# Patient Record
Sex: Male | Born: 1965 | Race: White | Hispanic: No | Marital: Married | Smoking: Former smoker
Health system: Southern US, Community
[De-identification: ages and names within clinical notes are randomized; demographics above are authoritative.]

## PROBLEM LIST (undated history)

## (undated) DIAGNOSIS — M1990 Unspecified osteoarthritis, unspecified site: Secondary | ICD-10-CM

## (undated) DIAGNOSIS — J45909 Unspecified asthma, uncomplicated: Secondary | ICD-10-CM

## (undated) DIAGNOSIS — M4722 Other spondylosis with radiculopathy, cervical region: Secondary | ICD-10-CM

## (undated) DIAGNOSIS — F419 Anxiety disorder, unspecified: Secondary | ICD-10-CM

## (undated) DIAGNOSIS — I10 Essential (primary) hypertension: Secondary | ICD-10-CM

## (undated) DIAGNOSIS — R519 Headache, unspecified: Secondary | ICD-10-CM

## (undated) DIAGNOSIS — R0602 Shortness of breath: Secondary | ICD-10-CM

## (undated) HISTORY — PX: WISDOM TOOTH EXTRACTION: SHX21

## (undated) HISTORY — PX: APPENDECTOMY: SHX54

## (undated) HISTORY — PX: INGUINAL HERNIA REPAIR: SUR1180

## (undated) HISTORY — PX: JOINT REPLACEMENT: SHX530

## (undated) HISTORY — PX: TOTAL KNEE ARTHROPLASTY: SHX125

---

## 2011-03-13 ENCOUNTER — Ambulatory Visit (HOSPITAL_COMMUNITY)
Admission: RE | Admit: 2011-03-13 | Discharge: 2011-03-13 | Disposition: A | Discharge status: Home / Self Care | Payer: Worker's Compensation | Source: Ambulatory Visit | Attending: Neurology | Admitting: Neurology

## 2011-03-13 DIAGNOSIS — R55 Syncope and collapse: Secondary | ICD-10-CM | POA: Insufficient documentation

## 2011-03-13 DIAGNOSIS — Z1389 Encounter for screening for other disorder: Secondary | ICD-10-CM | POA: Insufficient documentation

## 2011-03-13 NOTE — Procedures (Signed)
HISTORY:  A 45 year old male with episodes of syncope evaluated to rule out seizure.  MEDICATIONS:  Percocet and Benicar.  CONDITIONS OF RECORDING:  This is a 16-channel EEG carried out with the patient in the awake, drowsy and asleep states.  DISCUSSION:  The waking background activity consists of a low-voltage symmetrical fairly well-organized 9-10 Hz alpha activity seen from the parieto-occipital and posterotemporal regions.  Low-voltage fast activity poorly organized was seen and during at times superimposed on more posterior rhythms.  A mixture of theta and alpha rhythm was seen from the central and temporal regions.  The patient drowses with slowing to irregular which is theta and beta activity.  The patient goes into a light sleep briefly with symmetrical sleep spindles, everted to the sharp activity and irregular slow activity.  Hypoventilation was performed and elicited a mild-to-moderate buildup, but failed to elicit any abnormalities.  Intermittent photic stimulation was performed as well and failed to elicit any change in the tracing.  IMPRESSION:  This is a normal EEG.          ______________________________ Thana Farr, MD    RU:EAVW D:  03/13/2011 17:07:59  T:  03/13/2011 20:53:40  Job #:  [id number]

## 2012-02-23 ENCOUNTER — Encounter (HOSPITAL_COMMUNITY): Payer: Self-pay | Admitting: Pharmacy Technician

## 2012-03-01 NOTE — H&P (Signed)
Billy Shaffer 03/01/2012 9:20 AM [facility]: [facility] Patient #: [id number] DOB: [birth year] Undefined / Language: [place] / Race: Undefined Male   History of Present Illness(Billy Shelvin J [name], PA-C; 03/01/2012 9:22 AM) The patient is a 46 year old male who comes in today for a preoperative History and Physical. The patient is scheduled for a ACDF C3-5 for cervical facet arthrosis to be performed by Dr. Debria Garret D. Shon Baton, MD at [facility] on Wednesday, March 10, 2012 at 0830 . Please see the hospital record for complete dictated history and physical.    Allergies(Billy Raj [name], PA-C; 03/01/2012 9:50 AM) Norco *ANALGESICS - OPIOID*. itching Vicodin *ANALGESICS - OPIOID*. itching   Social History(Billy Shaffer J [name], PA-C; 03/01/2012 9:41 AM) Tobacco use. Former smoker, Recently quit tobacco use.   Medication History(Billy Shaffer [name], PA-C; 03/01/2012 9:42 AM) Lisinopril (40MG  Tablet, Oral) Active. ZyrTEC ( Oral) Specific dose unknown - Active. Chantix (1MG  Tablet, Oral two times daily) Active.   Past Surgical History(Billy Shaffer J [facility], PA-C; 03/01/2012 9:45 AM) Arthroscopic Knee Surgery - Left Appendectomy Open Inguinal Hernia Surgery - Both   Vitals(Billy Shaffer J [facility], PA-C; 03/01/2012 9:36 AM) 03/01/2012 9:35 AM Weight: 225 lb Height: 74 in Body Surface Area: 2.31 m Body Mass Index: 28.89 kg/m Pulse: 120 (Regular) BP: 104/87 (Sitting, Left Arm, Standard)    Physical Exam(Billy Shaffer [name], PA-C; 03/01/2012 11:02 AM) The physical exam findings are as follows:   General General Appearance- pleasant. Not in acute distress. Orientation- Oriented X3. Build & Nutrition- Well nourished and Well developed. Posture- Normal posture. Gait- Normal. Mental Status- Alert.   Integumentary General Characteristics:Surgical Scars- no surgical scar evidence of previous cervical surgery. Cervical Spine- Skin examination of the cervical spine is without deformity,  skin lesions, lacerations or abrasions.   Head and Neck Neck Global Assessment- supple. no lymphadenopathy and no nucchal rigidty.   Eye Pupil- Bilateral- Normal, Direct reaction to light normal, Equal and Regular. Motion- Bilateral- EOMI.   Chest and Lung Exam Auscultation: Breath sounds:- Clear.   Cardiovascular Auscultation:Rhythm- Regular rate and rhythm. Heart Sounds- Normal heart sounds.   Abdomen Palpation/Percussion:Palpation and Percussion of the abdomen reveal - Non Tender, No Rebound tenderness and Soft.   Peripheral Vascular Upper Extremity: Palpation:Radial pulse- Bilateral- 2+. Lower Extremity:Inspection- Bilateral- Inspection Normal. Palpation:Posterior tibial pulse- Bilateral- 2+. Dorsalis pedis pulse- Bilateral- 2+.   Neurologic Sensation:Upper Extremity- Bilateral- sensation is intact in the upper extremity. Reflexes:Biceps Reflex- Bilateral- 2+. Brachioradialis Reflex- Bilateral- 2+. Triceps Reflex- Bilateral- 2+. Babinski- Bilateral- Babinski not present. Hoffman's Sign- Bilateral- Hoffman's sign not present.   Musculoskeletal Spine/Ribs/Pelvis Cervical Spine : Inspection and Palpation:Tenderness- no soft tissue tenderness to palpation and no bony tenderness to palpation. bony/soft tissue palpation of the cervical spine and shoulders does not recreate their typical pain. Strength and Tone: Strength:Deltoid- Bilateral- 5/5. Biceps- Bilateral- 5/5. Triceps- Bilateral- 5/5. Wrist Extension- Bilateral- 5/5. Hand Grip- Bilateral- 5/5. Heel walk- Bilateral- able to heel walk without difficulty. Toe Walk- Bilateral- able to walk on toes without difficulty. Heel-Toe Walk- Bilateral- able to heel-toe walk without difficulty. ROM- Flexion- Full and painful. Extension- Full and painful. Left Rotation - Full. Right Rotation - Full. Pain:- neither flexion or extension is more painful than the  other. Special Testing- axial compression test negative and cross chest impingement test negative. Non-Anatomic Signs- No non-anatomic signs present. Upper Extremity Range of Motion:- No truesholder pain with IR/ER of the shoulders.   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 Risks/benefits/alternatives to the procedure/expectations following the procedure have been reviewed with him by Dr. Shon Shaffer. He understands.  I am going to order an MRI to evaluate to C3-4, C4-5 levels again as his most recent one is outdated at this time.   He has been medically cleared by Dr. Latricia Shaffer. Please see the scanned document in the office chart for the specifics. He has been fitted for a cervical collar and he knows to bring this with him the morning of surgery. He is scheduled for pre-op at [facility]. I have ordered a bone stimulator for him to use post-operatively as he is a prior smoker and is having a multilevel procedure.   All of his questions have been encouraged, addressed and answered. Plan, at this time, is to proceed with surgery as scheduled.   Signed electronically by Billy Maine, PA-C (03/01/2012 11:02 AM)  Billy Shaffer 12/30/2011 11:08 AM Location[facility] Patient #: [id number] WC DOB: 08/15/1965 Single / Language: [place] / Race: White Male   History of Present Illness(Billy Shaffer;  12/30/2011 11:14 AM) The patient is a 46 year old male who presents today for follow up of their neck. The patient is being followed for their central neck pain. Symptoms reported today include: pain (about the posterior cervical ), grinding, weakness (questionable about the left upper ext. ) and numbness (about the right hand (middle through small) and left upper ext. at times ). The patient feels that they are doing poorly. Current treatment includes: relative rest and activity modification. The following medication has been used for pain control: none. The patient presents today following ___ (bilat. facet C4-5 injections, performed on [phone] by Dr. Ethelene Hal ). The patient has reported improvement of their symptoms with: Cortisone injections (minimal relief a few days after injection).    Subjective Transcription(DAHARI D BROOKS, MD; 12/31/2011 4:10 PM)  He now returns after having a C4-5 facet injection. He did get some relief. It did not give as much relief as the 3-4 but he did show some improvement.    Allergies(Billy W Randa Lynn; 12/30/2011 11:14 AM) Wallene Dales. causes itching   Social History(Billy Shaffer; 12/30/2011 11:14 AM) Tobacco use. Current some day smoker. patient has decreased to 7-8 ciag. daily increased [id number]   Medication History(Billy W Randa Lynn; 12/30/2011 11:15 AM) Norco (5-325MG  Tablet, 1 (one) Oral every 8 hours as needed for pain, Taken 09/26/2011 to 10/26/2011) Inactive.   Assessment & Plan(Billy Shaffer; 12/30/2011 12:02 PM) Pain, cervical (723.1) Current Plans l MRI Cervical Spine (78295) (MRI Cervical pre-op eval. ; No metal ; Not chlaus.) l Restarted Percocet 5-325MG , 1 Tablet q 8hrs prn pain, #90, 30 days starting 12/30/2011, No Refill.   Plans Transcription(DAHARI D BROOKS, MD; 12/31/2011 4:10 PM)  At this point after a long discussion with the patient and his wife he would like to have both levels done. I think this is reasonable as there is a risk  of adjacent segment disease and the 4-5 becoming a problem and requiring another operation. After discussing this in great detail,  I think it is reasonable to proceed with a two level ACDF. We have gone over the risks which include infection, bleeding, nerve damage, death, stroke, paralysis, failure to heal, ongoing or worse pain, need for further surgery, loss of bowel and bladder control, nonunion, hardware complication. All questions were answered for the patient and his wife. We will plan on proceeding with the surgery in a timely fashion. He has not had an advanced imaging study since December and therefore I would like to get an MRI as we are greater than six months and I want to make sure there has been no significant changes. If there are, we will discuss them and if we need to alter the surgical plan we will but I do not foresee that currently. Because of the multilevel nature of the procedure and the fact that he still uses nicotine he has a higher than likely chance of a pseudoarthrosis. Given that I am going to recommend an external bone stimulator to be used for at least one year.    Miscellaneous Transcription(DAHARI Sheela Stack, MD; 12/31/2011 4:10 PM)  Alvy Beal, MD/MMK    T: 12/31/11  D: 12/30/11      Signed electronically by Alvy Beal, MD (12/30/2011 4:51 PM)

## 2012-03-03 ENCOUNTER — Encounter (HOSPITAL_COMMUNITY): Payer: Self-pay

## 2012-03-03 ENCOUNTER — Encounter (HOSPITAL_COMMUNITY)
Admission: RE | Admit: 2012-03-03 | Discharge: 2012-03-03 | Disposition: A | Discharge status: Home / Self Care | Payer: BC Managed Care – PPO | Source: Ambulatory Visit | Attending: Orthopedic Surgery | Admitting: Orthopedic Surgery

## 2012-03-03 ENCOUNTER — Encounter (HOSPITAL_COMMUNITY)
Admission: RE | Admit: 2012-03-03 | Discharge: 2012-03-03 | Disposition: A | Discharge status: Home / Self Care | Payer: BC Managed Care – PPO | Source: Ambulatory Visit | Attending: Physician Assistant | Admitting: Physician Assistant

## 2012-03-03 HISTORY — DX: Essential (primary) hypertension: I10

## 2012-03-03 HISTORY — DX: Unspecified asthma, uncomplicated: J45.909

## 2012-03-03 HISTORY — DX: Anxiety disorder, unspecified: F41.9

## 2012-03-03 LAB — CBC
HCT: 44.9 % (ref 39.0–52.0)
Hemoglobin: 15.6 g/dL (ref 13.0–17.0)
MCH: 32.1 pg (ref 26.0–34.0)
MCHC: 34.7 g/dL (ref 30.0–36.0)
MCV: 92.4 fL (ref 78.0–100.0)
Platelets: 215 10*3/uL (ref 150–400)
RBC: 4.86 MIL/uL (ref 4.22–5.81)
RDW: 13.8 % (ref 11.5–15.5)
WBC: 8.2 10*3/uL (ref 4.0–10.5)

## 2012-03-03 LAB — BASIC METABOLIC PANEL
BUN: 13 mg/dL (ref 6–23)
CO2: 27 mEq/L (ref 19–32)
Calcium: 9.7 mg/dL (ref 8.4–10.5)
Chloride: 97 mEq/L (ref 96–112)
Creatinine, Ser: 1 mg/dL (ref 0.50–1.35)
GFR calc Af Amer: >90 mL/min (ref >90)
GFR calc non Af Amer: 89 mL/min — ABNORMAL LOW (ref >90)
Glucose, Bld: 105 mg/dL — ABNORMAL HIGH (ref 70–99)
Potassium: 4.8 mEq/L (ref 3.5–5.1)
Sodium: 134 mEq/L — ABNORMAL LOW (ref 135–145)

## 2012-03-03 LAB — SURGICAL PCR SCREEN
MRSA, PCR: NEGATIVE
Staphylococcus aureus: NEGATIVE

## 2012-03-03 NOTE — Pre-Procedure Instructions (Signed)
Billy Shaffer  03/03/2012   Your procedure is scheduled on:  Wednesday March 10, 2012.  Report to [facility] at 0630 AM.  Call this number if you have problems the morning of surgery: [phone]   Remember:   Do not eat food or drink:After Midnight.    Take these medicines the morning of surgery with A SIP OF WATER: Hydrocodone (Vicodin) if needed for pain   Do not wear jewelry  Do not wear lotions or colognes.   Men may shave face and neck.  Do not bring valuables to the hospital.  Contacts, dentures or bridgework may not be worn into surgery.  Leave suitcase in the car. After surgery it may be brought to your room.  For patients admitted to the hospital, checkout time is 11:00 AM the day of discharge.   Patients discharged the day of surgery will not be allowed to drive home.  Name and phone number of your driver:   Special Instructions: CHG Shower Use Special Wash: 1/2 bottle night before surgery and 1/2 bottle morning of surgery.   Please read over the following fact sheets that you were given: Pain Booklet, Coughing and Deep Breathing, MRSA Information and Surgical Site Infection Prevention

## 2012-03-09 MED ORDER — CEFAZOLIN SODIUM-DEXTROSE 2-3 GM-% IV SOLR
2.0000 g | INTRAVENOUS | Status: DC
  Filled 2012-03-09: qty 50

## 2012-03-10 ENCOUNTER — Ambulatory Visit (HOSPITAL_COMMUNITY): Payer: BC Managed Care – PPO

## 2012-03-10 ENCOUNTER — Encounter (HOSPITAL_COMMUNITY): Payer: Self-pay | Admitting: Anesthesiology

## 2012-03-10 ENCOUNTER — Encounter (HOSPITAL_COMMUNITY)
Admission: RE | Disposition: A | Discharge status: Home / Self Care | Payer: Self-pay | Source: Ambulatory Visit | Attending: Orthopedic Surgery

## 2012-03-10 ENCOUNTER — Ambulatory Visit (HOSPITAL_COMMUNITY): Payer: BC Managed Care – PPO | Admitting: Anesthesiology

## 2012-03-10 ENCOUNTER — Ambulatory Visit (HOSPITAL_COMMUNITY)
Admission: RE | Admit: 2012-03-10 | Discharge: 2012-03-11 | Disposition: A | Discharge status: Home / Self Care | Payer: BC Managed Care – PPO | Source: Ambulatory Visit | Attending: Orthopedic Surgery | Admitting: Orthopedic Surgery

## 2012-03-10 DIAGNOSIS — I10 Essential (primary) hypertension: Secondary | ICD-10-CM | POA: Insufficient documentation

## 2012-03-10 DIAGNOSIS — F411 Generalized anxiety disorder: Secondary | ICD-10-CM | POA: Insufficient documentation

## 2012-03-10 DIAGNOSIS — M5412 Radiculopathy, cervical region: Secondary | ICD-10-CM

## 2012-03-10 DIAGNOSIS — M47812 Spondylosis without myelopathy or radiculopathy, cervical region: Secondary | ICD-10-CM | POA: Insufficient documentation

## 2012-03-10 HISTORY — PX: ANTERIOR CERVICAL DECOMP/DISCECTOMY FUSION: SHX1161

## 2012-03-10 SURGERY — ANTERIOR CERVICAL DECOMPRESSION/DISCECTOMY FUSION 2 LEVELS
Anesthesia: General | Site: Spine Cervical | Wound class: Clean

## 2012-03-10 MED ORDER — OXYCODONE HCL 5 MG PO TABS
10.0000 mg | ORAL_TABLET | ORAL | Status: DC | PRN
  Administered 2012-03-10 – 2012-03-11 (×5): 10 mg via ORAL
  Filled 2012-03-10 (×5): qty 2

## 2012-03-10 MED ORDER — CEFAZOLIN SODIUM-DEXTROSE 2-3 GM-% IV SOLR
INTRAVENOUS | Status: DC | PRN
  Administered 2012-03-10: 2 g via INTRAVENOUS

## 2012-03-10 MED ORDER — LISINOPRIL 40 MG PO TABS
40.0000 mg | ORAL_TABLET | Freq: Every day | ORAL | Status: DC
  Administered 2012-03-10 – 2012-03-11 (×2): 40 mg via ORAL
  Filled 2012-03-10 (×2): qty 1

## 2012-03-10 MED ORDER — BUPIVACAINE-EPINEPHRINE 0.25% -1:200000 IJ SOLN
INTRAMUSCULAR | Status: DC | PRN
  Administered 2012-03-10: 3 mL

## 2012-03-10 MED ORDER — CEFAZOLIN SODIUM 1-5 GM-% IV SOLN
1.0000 g | Freq: Three times a day (TID) | INTRAVENOUS | Status: AC
  Administered 2012-03-10 – 2012-03-11 (×2): 1 g via INTRAVENOUS
  Filled 2012-03-10 (×2): qty 50

## 2012-03-10 MED ORDER — HYDROMORPHONE HCL PF 1 MG/ML IJ SOLN
INTRAMUSCULAR | Status: AC
  Administered 2012-03-10: 0.5 mg
  Filled 2012-03-10: qty 1

## 2012-03-10 MED ORDER — LIDOCAINE HCL (CARDIAC) 20 MG/ML IV SOLN
INTRAVENOUS | Status: DC | PRN
  Administered 2012-03-10: 60 mg via INTRAVENOUS

## 2012-03-10 MED ORDER — NEOSTIGMINE METHYLSULFATE 1 MG/ML IJ SOLN
INTRAMUSCULAR | Status: DC | PRN
  Administered 2012-03-10: 5 mg via INTRAVENOUS

## 2012-03-10 MED ORDER — FENTANYL CITRATE 0.05 MG/ML IJ SOLN
INTRAMUSCULAR | Status: AC
  Filled 2012-03-10: qty 2

## 2012-03-10 MED ORDER — DEXAMETHASONE SODIUM PHOSPHATE 4 MG/ML IJ SOLN
4.0000 mg | Freq: Four times a day (QID) | INTRAMUSCULAR | Status: DC
  Administered 2012-03-11: 4 mg via INTRAVENOUS
  Filled 2012-03-10 (×7): qty 1

## 2012-03-10 MED ORDER — EPHEDRINE SULFATE 50 MG/ML IJ SOLN
INTRAMUSCULAR | Status: DC | PRN
  Administered 2012-03-10 (×4): 10 mg via INTRAVENOUS

## 2012-03-10 MED ORDER — MIDAZOLAM HCL 5 MG/5ML IJ SOLN
INTRAMUSCULAR | Status: DC | PRN
  Administered 2012-03-10: 2 mg via INTRAVENOUS

## 2012-03-10 MED ORDER — DOCUSATE SODIUM 100 MG PO CAPS
100.0000 mg | ORAL_CAPSULE | Freq: Two times a day (BID) | ORAL | Status: DC
  Administered 2012-03-10 – 2012-03-11 (×2): 100 mg via ORAL
  Filled 2012-03-10 (×3): qty 1

## 2012-03-10 MED ORDER — HYDROMORPHONE HCL PF 1 MG/ML IJ SOLN
0.5000 mg | Freq: Once | INTRAMUSCULAR | Status: AC
  Administered 2012-03-10: 0.5 mg via INTRAVENOUS

## 2012-03-10 MED ORDER — ONDANSETRON HCL 4 MG/2ML IJ SOLN: 4.0000 mg | INTRAMUSCULAR | Status: DC | PRN

## 2012-03-10 MED ORDER — THROMBIN 20000 UNITS EX KIT
PACK | OROMUCOSAL | Status: DC | PRN
  Administered 2012-03-10: 09:00:00 via TOPICAL

## 2012-03-10 MED ORDER — SODIUM CHLORIDE 0.9 % IV SOLN: 250.0000 mL | INTRAVENOUS | Status: DC

## 2012-03-10 MED ORDER — DEXAMETHASONE SODIUM PHOSPHATE 10 MG/ML IJ SOLN
10.0000 mg | Freq: Once | INTRAMUSCULAR | Status: DC
  Filled 2012-03-10: qty 1

## 2012-03-10 MED ORDER — HYDROMORPHONE HCL PF 1 MG/ML IJ SOLN
INTRAMUSCULAR | Status: AC
  Administered 2012-03-10 (×2): 0.5 mg via INTRAVENOUS
  Filled 2012-03-10: qty 1

## 2012-03-10 MED ORDER — ACETAMINOPHEN 10 MG/ML IV SOLN
INTRAVENOUS | Status: DC | PRN
  Administered 2012-03-10: 1000 mg via INTRAVENOUS

## 2012-03-10 MED ORDER — LIDOCAINE HCL 4 % MT SOLN
OROMUCOSAL | Status: DC | PRN
  Administered 2012-03-10: 4 mL via TOPICAL

## 2012-03-10 MED ORDER — SUFENTANIL CITRATE 50 MCG/ML IV SOLN
INTRAVENOUS | Status: DC | PRN
  Administered 2012-03-10 (×3): 10 ug via INTRAVENOUS
  Administered 2012-03-10: 20 ug via INTRAVENOUS

## 2012-03-10 MED ORDER — PROPOFOL 10 MG/ML IV EMUL
INTRAVENOUS | Status: DC | PRN
  Administered 2012-03-10: 250 mg via INTRAVENOUS

## 2012-03-10 MED ORDER — ACETAMINOPHEN 10 MG/ML IV SOLN
INTRAVENOUS | Status: AC
  Filled 2012-03-10: qty 100

## 2012-03-10 MED ORDER — PHENYLEPHRINE HCL 10 MG/ML IJ SOLN
INTRAMUSCULAR | Status: DC | PRN
  Administered 2012-03-10 (×7): 80 ug via INTRAVENOUS

## 2012-03-10 MED ORDER — ROCURONIUM BROMIDE 100 MG/10ML IV SOLN
INTRAVENOUS | Status: DC | PRN
  Administered 2012-03-10: 10 mg via INTRAVENOUS
  Administered 2012-03-10: 20 mg via INTRAVENOUS
  Administered 2012-03-10: 50 mg via INTRAVENOUS

## 2012-03-10 MED ORDER — THROMBIN 20000 UNITS EX SOLR
CUTANEOUS | Status: AC
  Filled 2012-03-10: qty 20000

## 2012-03-10 MED ORDER — ONDANSETRON HCL 4 MG/2ML IJ SOLN
INTRAMUSCULAR | Status: DC | PRN
  Administered 2012-03-10: 4 mg via INTRAVENOUS

## 2012-03-10 MED ORDER — BUPIVACAINE-EPINEPHRINE PF 0.25-1:200000 % IJ SOLN
INTRAMUSCULAR | Status: AC
  Filled 2012-03-10: qty 30

## 2012-03-10 MED ORDER — MENTHOL 3 MG MT LOZG: 1.0000 | LOZENGE | OROMUCOSAL | Status: DC | PRN

## 2012-03-10 MED ORDER — MORPHINE SULFATE 2 MG/ML IJ SOLN
1.0000 mg | INTRAMUSCULAR | Status: DC | PRN
  Administered 2012-03-10 (×2): 2 mg via INTRAVENOUS
  Administered 2012-03-10 (×2): 4 mg via INTRAVENOUS
  Filled 2012-03-10: qty 1
  Filled 2012-03-10: qty 2
  Filled 2012-03-10: qty 1
  Filled 2012-03-10: qty 2

## 2012-03-10 MED ORDER — ZOLPIDEM TARTRATE 5 MG PO TABS
5.0000 mg | ORAL_TABLET | Freq: Every evening | ORAL | Status: DC | PRN
  Administered 2012-03-10: 5 mg via ORAL
  Filled 2012-03-10: qty 1

## 2012-03-10 MED ORDER — PHENOL 1.4 % MT LIQD: 1.0000 | OROMUCOSAL | Status: DC | PRN

## 2012-03-10 MED ORDER — LACTATED RINGERS IV SOLN
INTRAVENOUS | Status: DC
  Administered 2012-03-10: 16:00:00 via INTRAVENOUS

## 2012-03-10 MED ORDER — ACETAMINOPHEN 10 MG/ML IV SOLN
1000.0000 mg | Freq: Once | INTRAVENOUS | Status: AC
  Filled 2012-03-10: qty 100

## 2012-03-10 MED ORDER — SODIUM CHLORIDE 0.9 % IJ SOLN: 3.0000 mL | Freq: Two times a day (BID) | INTRAMUSCULAR | Status: DC

## 2012-03-10 MED ORDER — 0.9 % SODIUM CHLORIDE (POUR BTL) OPTIME
TOPICAL | Status: DC | PRN
  Administered 2012-03-10: 1000 mL

## 2012-03-10 MED ORDER — LACTATED RINGERS IV SOLN: INTRAVENOUS | Status: DC

## 2012-03-10 MED ORDER — SODIUM CHLORIDE 0.9 % IJ SOLN: 3.0000 mL | INTRAMUSCULAR | Status: DC | PRN

## 2012-03-10 MED ORDER — METHOCARBAMOL 100 MG/ML IJ SOLN
500.0000 mg | Freq: Four times a day (QID) | INTRAMUSCULAR | Status: DC | PRN
  Administered 2012-03-10: 500 mg via INTRAVENOUS
  Filled 2012-03-10: qty 5

## 2012-03-10 MED ORDER — ACETAMINOPHEN 10 MG/ML IV SOLN
1000.0000 mg | Freq: Four times a day (QID) | INTRAVENOUS | Status: DC
  Administered 2012-03-10 – 2012-03-11 (×3): 1000 mg via INTRAVENOUS
  Filled 2012-03-10 (×4): qty 100

## 2012-03-10 MED ORDER — METHOCARBAMOL 500 MG PO TABS
500.0000 mg | ORAL_TABLET | Freq: Four times a day (QID) | ORAL | Status: DC | PRN
  Administered 2012-03-10 – 2012-03-11 (×2): 500 mg via ORAL
  Filled 2012-03-10 (×3): qty 1

## 2012-03-10 MED ORDER — LACTATED RINGERS IV SOLN
INTRAVENOUS | Status: DC | PRN
  Administered 2012-03-10 (×3): via INTRAVENOUS

## 2012-03-10 MED ORDER — GLYCOPYRROLATE 0.2 MG/ML IJ SOLN
INTRAMUSCULAR | Status: DC | PRN
  Administered 2012-03-10: .8 mg via INTRAVENOUS

## 2012-03-10 MED ORDER — DEXAMETHASONE 4 MG PO TABS
4.0000 mg | ORAL_TABLET | Freq: Four times a day (QID) | ORAL | Status: DC
  Administered 2012-03-10 (×2): 4 mg via ORAL
  Filled 2012-03-10 (×7): qty 1

## 2012-03-10 MED ORDER — FENTANYL CITRATE 0.05 MG/ML IJ SOLN
25.0000 ug | INTRAMUSCULAR | Status: DC | PRN
  Administered 2012-03-10 (×3): 50 ug via INTRAVENOUS

## 2012-03-10 SURGICAL SUPPLY — 64 items
BLADE SURG ROTATE 9660 (MISCELLANEOUS) IMPLANT
BUR EGG ELITE 4.0 (BURR) IMPLANT
BUR MATCHSTICK NEURO 3.0 LAGG (BURR) IMPLANT
CANISTER SUCTION 2500CC (MISCELLANEOUS) ×2 IMPLANT
CLOTH BEACON ORANGE TIMEOUT ST (SAFETY) ×2 IMPLANT
CLSR STERI-STRIP ANTIMIC 1/2X4 (GAUZE/BANDAGES/DRESSINGS) ×2 IMPLANT
CORDS BIPOLAR (ELECTRODE) ×2 IMPLANT
COVER SURGICAL LIGHT HANDLE (MISCELLANEOUS) ×2 IMPLANT
CRADLE DONUT ADULT HEAD (MISCELLANEOUS) ×2 IMPLANT
DERMABOND ADVANCED (GAUZE/BANDAGES/DRESSINGS) ×1
DERMABOND ADVANCED .7 DNX12 (GAUZE/BANDAGES/DRESSINGS) ×1 IMPLANT
DEVICE ENDSKLTN TC MED 8MM (Orthopedic Implant) ×1 IMPLANT
DISTRACTION PIN ×4 IMPLANT
DRAPE C-ARM 42X72 X-RAY (DRAPES) ×2 IMPLANT
DRAPE POUCH INSTRU U-SHP 10X18 (DRAPES) ×2 IMPLANT
DRAPE SURG 17X23 STRL (DRAPES) ×2 IMPLANT
DRAPE U-SHAPE 47X51 STRL (DRAPES) ×2 IMPLANT
DRSG MEPILEX BORDER 4X4 (GAUZE/BANDAGES/DRESSINGS) ×2 IMPLANT
DURAPREP 26ML APPLICATOR (WOUND CARE) ×2 IMPLANT
ELECT COATED BLADE 2.86 ST (ELECTRODE) ×2 IMPLANT
ELECT REM PT RETURN 9FT ADLT (ELECTROSURGICAL) ×2
ELECTRODE REM PT RTRN 9FT ADLT (ELECTROSURGICAL) ×1 IMPLANT
ENDOSKELTON TC IMPLANT 8MM MED (Orthopedic Implant) ×2 IMPLANT
GLOVE BIOGEL PI IND STRL 6.5 (GLOVE) ×1 IMPLANT
GLOVE BIOGEL PI IND STRL 8.5 (GLOVE) ×1 IMPLANT
GLOVE BIOGEL PI INDICATOR 6.5 (GLOVE) ×1
GLOVE BIOGEL PI INDICATOR 8.5 (GLOVE) ×1
GLOVE ECLIPSE 6.0 STRL STRAW (GLOVE) ×2 IMPLANT
GLOVE ECLIPSE 8.5 STRL (GLOVE) ×2 IMPLANT
GOWN PREVENTION PLUS XXLARGE (GOWN DISPOSABLE) ×2 IMPLANT
GOWN STRL NON-REIN LRG LVL3 (GOWN DISPOSABLE) ×4 IMPLANT
KIT BASIN OR (CUSTOM PROCEDURE TRAY) ×2 IMPLANT
KIT ROOM TURNOVER OR (KITS) ×2 IMPLANT
LORDOTIC CERVICAL VBR MED 9MM (Bone Implant) ×2 IMPLANT
NEEDLE SPNL 18GX3.5 QUINCKE PK (NEEDLE) ×2 IMPLANT
NS IRRIG 1000ML POUR BTL (IV SOLUTION) ×2 IMPLANT
PACK ORTHO CERVICAL (CUSTOM PROCEDURE TRAY) ×2 IMPLANT
PACK UNIVERSAL I (CUSTOM PROCEDURE TRAY) ×2 IMPLANT
PAD ARMBOARD 7.5X6 YLW CONV (MISCELLANEOUS) ×4 IMPLANT
PIN FIXATION TEMP (PIN) ×2 IMPLANT
PLATE VECTRA 36MM (Plate) ×2 IMPLANT
PUTTY BONE DBX 2.5 MIS (Bone Implant) ×2 IMPLANT
SCREW 4.0X14MM (Screw) ×1 IMPLANT
SCREW 4.0X16MM (Screw) ×2 IMPLANT
SCREW BN 14X4XSLF DRL VA SLF (Screw) ×1 IMPLANT
SPONGE INTESTINAL PEANUT (DISPOSABLE) ×6 IMPLANT
SPONGE LAP 4X18 X RAY DECT (DISPOSABLE) ×2 IMPLANT
SPONGE SURGIFOAM ABS GEL 100 (HEMOSTASIS) ×2 IMPLANT
STRIP CLOSURE SKIN 1/2X4 (GAUZE/BANDAGES/DRESSINGS) ×2 IMPLANT
SURGIFLO TRUKIT (HEMOSTASIS) IMPLANT
SUT MNCRL AB 3-0 PS2 18 (SUTURE) ×2 IMPLANT
SUT SILK 2 0 (SUTURE) ×1
SUT SILK 2-0 18XBRD TIE 12 (SUTURE) ×1 IMPLANT
SUT VIC AB 2-0 CT1 18 (SUTURE) ×2 IMPLANT
SUT VIC AB 3-0 54X BRD REEL (SUTURE) IMPLANT
SUT VIC AB 3-0 BRD 54 (SUTURE)
SYR BULB IRRIGATION 50ML (SYRINGE) ×2 IMPLANT
SYR CONTROL 10ML LL (SYRINGE) ×2 IMPLANT
TAPE CLOTH 4X10 WHT NS (GAUZE/BANDAGES/DRESSINGS) ×2 IMPLANT
TAPE UMBILICAL COTTON 1/8X30 (MISCELLANEOUS) ×2 IMPLANT
TOWEL OR 17X24 6PK STRL BLUE (TOWEL DISPOSABLE) ×2 IMPLANT
TOWEL OR 17X26 10 PK STRL BLUE (TOWEL DISPOSABLE) ×2 IMPLANT
TRAY FOLEY CATH 14FR (SET/KITS/TRAYS/PACK) IMPLANT
WATER STERILE IRR 1000ML POUR (IV SOLUTION) ×2 IMPLANT

## 2012-03-10 NOTE — Anesthesia Preprocedure Evaluation (Signed)
Anesthesia Evaluation  Patient identified by MRN, date of birth, ID band Patient awake    Reviewed: Allergy & Precautions, H&P , NPO status , Patient's Chart, lab work & pertinent test results  Airway Mallampati: II      Dental   Pulmonary  History of bronchitis         Cardiovascular hypertension, Pt. on medications     Neuro/Psych negative neurological ROS     GI/Hepatic negative GI ROS, Neg liver ROS,   Endo/Other  negative endocrine ROS  Renal/GU negative Renal ROS     Musculoskeletal   Abdominal   Peds  Hematology   Anesthesia Other Findings   Reproductive/Obstetrics                           Anesthesia Physical Anesthesia Plan  ASA: III  Anesthesia Plan: General   Post-op Pain Management:    Induction: Intravenous  Airway Management Planned: Oral ETT  Additional Equipment:   Intra-op Plan:   Post-operative Plan: Extubation in OR  Informed Consent:   Plan Discussed with: CRNA, Anesthesiologist and Surgeon  Anesthesia Plan Comments:         Anesthesia Quick Evaluation

## 2012-03-10 NOTE — Brief Op Note (Signed)
03/10/2012  11:38 AM  PATIENT:  Billy Shaffer  46 y.o. male  PRE-OPERATIVE DIAGNOSIS:  CERVICLE SACET ARTHROSIS  POST-OPERATIVE DIAGNOSIS:  CERVICLE SACET ARTHROSIS  PROCEDURE:  Procedure(s) (LRB): ANTERIOR CERVICAL DECOMPRESSION/DISCECTOMY FUSION 2 LEVELS (N/A)  SURGEON:  Surgeon(s) and Role:    * Venita Lick, MD - Primary  PHYSICIAN ASSISTANT:   ASSISTANTS: Norval Gable   ANESTHESIA:   general  EBL:  Total I/O In: 2000 [I.V.:2000] Out: -   BLOOD ADMINISTERED:none  DRAINS: none   LOCAL MEDICATIONS USED:  MARCAINE     SPECIMEN:  No Specimen  DISPOSITION OF SPECIMEN:  N/A  COUNTS:  YES  TOURNIQUET:  * No tourniquets in log *  DICTATION: .Other Dictation: Dictation Number [id number]  PLAN OF CARE: Admit for overnight observation  PATIENT DISPOSITION:  PACU - hemodynamically stable.

## 2012-03-10 NOTE — Anesthesia Procedure Notes (Addendum)
Performed by: Glendora Score A   Procedure Name: Intubation Date/Time: 03/10/2012 8:51 AM Performed by: Glendora Score A Pre-anesthesia Checklist: Patient identified, Emergency Drugs available, Suction available and Patient being monitored Patient Re-evaluated:Patient Re-evaluated prior to inductionOxygen Delivery Method: Circle system utilized Preoxygenation: Pre-oxygenation with 100% oxygen Intubation Type: IV induction Ventilation: Mask ventilation without difficulty and Oral airway inserted - appropriate to patient size Laryngoscope Size: Hyacinth Meeker and 2 Grade View: Grade I Tube type: Oral Tube size: 7.5 mm Number of attempts: 1 Airway Equipment and Method: Stylet and LTA kit utilized Placement Confirmation: ETT inserted through vocal cords under direct vision,  positive ETCO2 and breath sounds checked- equal and bilateral Secured at: 23 cm Tube secured with: Tape Dental Injury: Teeth and Oropharynx as per pre-operative assessment

## 2012-03-10 NOTE — Anesthesia Postprocedure Evaluation (Signed)
  Anesthesia Post-op Note  Patient: Billy Shaffer  Procedure(s) Performed: Procedure(s) (LRB): ANTERIOR CERVICAL DECOMPRESSION/DISCECTOMY FUSION 2 LEVELS (N/A)  Patient Location: PACU  Anesthesia Type: General  Level of Consciousness: awake  Airway and Oxygen Therapy: Patient Spontanous Breathing  Post-op Pain: mild  Post-op Assessment: Post-op Vital signs reviewed  Post-op Vital Signs: Reviewed  Complications: No apparent anesthesia complications

## 2012-03-10 NOTE — Preoperative (Signed)
Beta Blockers   Reason not to administer Beta Blockers:Not Applicable 

## 2012-03-10 NOTE — Transfer of Care (Signed)
Immediate Anesthesia Transfer of Care Note  Patient: Billy Shaffer  Procedure(s) Performed: Procedure(s) (LRB): ANTERIOR CERVICAL DECOMPRESSION/DISCECTOMY FUSION 2 LEVELS (N/A)  Patient Location: PACU  Anesthesia Type: General  Level of Consciousness: awake, alert , oriented and patient cooperative  Airway & Oxygen Therapy: Patient Spontanous Breathing and Patient connected to face mask oxygen  Post-op Assessment: Report given to PACU RN  Post vital signs: Reviewed and stable  Complications: No apparent anesthesia complications

## 2012-03-10 NOTE — H&P (Signed)
No change in exam H+P reviewed  

## 2012-03-11 MED ORDER — HYDROMORPHONE HCL PF 1 MG/ML IJ SOLN
2.0000 mg | INTRAMUSCULAR | Status: DC | PRN
  Administered 2012-03-11 (×3): 1 mg via INTRAVENOUS
  Filled 2012-03-11 (×2): qty 1

## 2012-03-11 MED ORDER — DIAZEPAM 5 MG PO TABS: 5.0000 mg | ORAL_TABLET | Freq: Three times a day (TID) | ORAL | Status: DC | PRN

## 2012-03-11 MED ORDER — POLYETHYLENE GLYCOL 3350 17 G PO PACK: 17.0000 g | PACK | Freq: Every day | ORAL | Status: AC

## 2012-03-11 MED ORDER — OXYCODONE-ACETAMINOPHEN 10-325 MG PO TABS: 1.0000 | ORAL_TABLET | Freq: Four times a day (QID) | ORAL | Status: DC | PRN

## 2012-03-11 MED ORDER — ONDANSETRON HCL 4 MG PO TABS: 4.0000 mg | ORAL_TABLET | Freq: Three times a day (TID) | ORAL | Status: DC | PRN

## 2012-03-11 MED ORDER — DIAZEPAM 5 MG PO TABS
5.0000 mg | ORAL_TABLET | Freq: Three times a day (TID) | ORAL | Status: DC | PRN
  Administered 2012-03-11: 5 mg via ORAL
  Filled 2012-03-11: qty 1

## 2012-03-11 MED ORDER — MORPHINE SULFATE 25 MG/ML IV SOLN
1.0000 mg/h | INTRAVENOUS | Status: DC
  Filled 2012-03-11: qty 10

## 2012-03-11 NOTE — Op Note (Signed)
NAME:  Billy Shaffer, Billy Shaffer NO.:  [id number]  MEDICAL RECORD NO.:  [id number]  LOCATION:  [id number]                        FACILITY:  [facility]  PHYSICIAN:  Alvy Beal, MD    DATE OF BIRTH:  Apr 12, 1966  DATE OF PROCEDURE:  03/10/2012 DATE OF DISCHARGE:                              OPERATIVE REPORT   PREOPERATIVE DIAGNOSIS:  Axillary neck pain due to severe facet arthrosis at C3-4, C4-5.  POSTOPERATIVE DIAGNOSIS:  Axillary neck pain due to severe facet arthrosis at C3-4, C4-5.  OPERATIVE PROCEDURE:  Anterior cervical diskectomy and fusion at C3-4, C4-5.  FIRST ASSISTANT:  Norval Gable, [place].  INSTRUMENT USED:  Instrumentation system used was the titanium intervertebral spacer at C3-4 used a size 8 medium lordotic packed with DBX mix at C4-5.  We used a size 9 medium lordotic again packed with DBX mix.  An anterior cervical plating system was a Synthes Vectra 36 mm long with 16 mm screws into the body of C3, 14 screw at the body of C4 and C5.  COMPLICATIONS:  None.  CONDITION:  Stable.  HISTORY:  This is a very pleasant gentleman who has long-standing severe neck pain with radiation into the trapezius.  The patient's symptoms only improved temporarily with C3-4, C4-5 facet blocks and the diagnosis of facet-mediated pain was made, we attempted to treat him conservatively.  The patient's quality of life continued to deteriorate and as a result, he elected to proceed with surgery.  All appropriate risks, benefits, and alternatives to surgery were discussed and consent was obtained.  OPERATIVE NOTE:  The patient was brought to the operating room, placed supine on the operating table.  After successful induction of general anesthesia and endotracheal intubation, TEDs, SCDs were applied.  Rolled towels were placed between the shoulder blades.  The shoulders were taped down.  The anterior cervical spine was prepped and draped in standard fashion.  Once this was done, a  time-out was done to confirm patient, procedure, and all other pertinent important data.  Once this was completed, I infiltrated the incision site with 0.25% Marcaine, I made a longitudinal incision spanning the 3-4, 4-5 disk space.  This is a left-sided longitudinal incision.  Sharp dissection was carried out down to the platysma.  The platysma was sharply incised.  I began dissecting along the internervous plane between the sternocleidomastoid and the strap muscles.  I was able to gently manipulate and sweep the veins out of the way and generated blunt dissection plane down through the deep cervical and prevertebral fascia, had palpated the anterior longitudinal ligament.  I placed an appendiceal retractor and swept the esophagus and trachea to the right and began mobilizing and exposing the anterior longitudinal ligament.  There were a few crossing vessels which could not be mobilized, and so these were tied with a suture and ligated.  At this point, I had clear visualization of the 3-4, 4-5 disk space.  I placed a needle into the 4-5 disk space and confirmed that I was at the appropriate level.  Once this was done, I used bipolar electrocautery to mobilize the longus coli muscles out to the level of the uncovertebral joint.  From the midbody of C3 to the midbody of C5. Once this was done bilaterally.  I placed self-retaining retractor underneath the longus coli muscle, deflated the endotracheal cuff, expanded the retractor blades and then reinflated the endotracheal cuff. Distraction pins were placed into the bodies of C3 and C4.  I gently distracted the disk space.  I then used a 15 blade scalpel to perform an annulotomy.  Using a combination of pituitary rongeurs, curettes, and Kerrison rongeurs, I removed all the disk material at the 3-4 disk space.  I then used a 1 mm Kerrison to resect the posterior anulus and exposed the posterior longitudinal ligament.  Once this was done, I  rasped the endplates until I had bleeding subchondral bone.  I then went underneath the uncovertebral joints and resected the osteophytes.  At this point, I then trialed and elected to use the 8 mm high lordotic medium cage. This was packed with DBX mix and malleted to the appropriate depth.  I had excellent purchase and good fixation.  At this point, I removed the distraction pin for the body of C3 and positioned it into the body of the C5.  I repositioned the self-retaining retractors and then using the same technique, I performed a C4-5 diskectomy.  Once the entire disk was removed and the posterior anulus was resected I rasped the endplates and removed the osteophytes from the undersurface of the uncovertebral joint.  Once I had bleeding subchondral bone.  I then measured with sequential to trial devices and elected to use a 9 medium trial.  Again, this was packed with DBX and malleted to the appropriate depth. At this point with both intervertebral cages placed and had good fixation.  I removed the distraction pins and then placed bone wax in the bone holes.  I then contoured a 36 mm anterior cervical Vectra plate and placed into the wound.  I protected the esophagus and trachea.  With appropriate retractors and then secured the plate to the C3 vertebral body with 16 mm screws.  I secured it down to C4-5 with 14 mm screws.  I then secured it down into the C4 with 14 mm screws.  All screws were then tightened down to the plate.  All had excellent purchase.  I then removed the retracting devices irrigated copiously with normal saline. Hemostasis was obtained using bipolar electrocautery.  I then checked to ensure the esophagus did not become entrapped beneath the plate.  Once sure with direct visualization that there was no soft tissue compression underneath the plate.  I then returned the trachea and esophagus to midline.  I then irrigated again and then closed the platysma  with interrupted 2-0 Vicryl sutures, and 3-0 Monocryl for the skin.  Steri- Strips, dry dressing, and Aspen collar were applied.  The patient was extubated and transferred to PACU without incident.  At the end of the case, there was no adverse intraoperative events, and the patient was hemodynamically stable and transferred to the PACU.     Alvy Beal, MD     DDB/MEDQ  D:  03/10/2012  T:  03/11/2012  Job:  [id number]

## 2012-03-11 NOTE — Discharge Summary (Signed)
Patient ID: Billy Shaffer MRN: [id number] DOB/AGE: 01/23/1966 46 y.o.  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 Hypertension     takes med  . Anxiety     impending surgery  . Bronchitis, allergic     usese inhaler at times Ventolin    Surgeries: Procedure(s): ANTERIOR CERVICAL DECOMPRESSION/DISCECTOMY FUSION 2 LEVELS on 03/10/2012   Consultants: none  Discharged Condition: Improved  Hospital Course: Billy Shaffer is an 46 y.o. male who was admitted 03/10/2012 for operative treatment of axillary neck pain and occassional cervical radiculopathy. Patient failed conservative treatments (please see the history and physical for the specifics) and had severe unremitting pain that affects sleep, daily activities and work/hobbies. After pre-op clearance, the patient was taken to the operating room on 03/10/2012 and underwent  Procedure(s): ANTERIOR CERVICAL DECOMPRESSION/DISCECTOMY FUSION 2 LEVELS.    Patient was given perioperative antibiotics: Anti-infectives     Start     Dose/Rate Route Frequency Ordered Stop   03/10/12 1700   ceFAZolin (ANCEF) IVPB 1 g/50 mL premix        1 g 100 mL/hr over 30 Minutes Intravenous Every 8 hours 03/10/12 1409 03/11/12 0118   03/09/12 1416   ceFAZolin (ANCEF) IVPB 2 g/50 mL premix  Status:  Discontinued        2 g 100 mL/hr over 30 Minutes Intravenous 60 min pre-op 03/09/12 1416 03/10/12 1406           Patient was given sequential compression devices and early ambulation to prevent DVT.   Patient benefited maximally from hospital stay and there were no complications. At the time of discharge, the  patient was urinating/moving their bowels without difficulty, tolerating a regular diet, pain is controlled with oral pain medications and they have been cleared by PT/OT.   Recent vital signs: Patient Vitals for the past 24 hrs:  BP Temp Temp src Pulse Resp SpO2 Height Weight  03/11/12 0658 142/82 mmHg 98.2 F (36.8 C) - 99  18  98 % - -  03/11/12 0656 140/77 mmHg 97.9 F (36.6 C) - 100  18  97 % - -  03/10/12 2337 138/75 mmHg 97.8 F (36.6 C) - 102  18  99 % - -  03/10/12 1601 119/82 mmHg 97 F (36.1 C) Oral 102  14  98 % - -  03/10/12 1543 - - - - - - 6\' 2"  (1.88 m) 103.5 kg (228 lb 2.8 oz)  03/10/12 1330 - 97 F (36.1 C) - - - - - -  03/10/12 1315 137/82 mmHg - - 101  14  96 % - -  03/10/12 1300 - - - 99  13  94 % - -  03/10/12 1245 138/93 mmHg - - 101  11  93 % - -  03/10/12 1230 - - - 93  15  95 % - -  03/10/12 1215 101/78 mmHg - - 95  17  93 % - -  03/10/12 1200 127/66 mmHg 97.3 F (36.3 C) - 103  20  99 % - -     Recent laboratory studies: No results found for this basename: WBC:2,HGB:2,HCT:2,PLT:2,NA:2,K:2,CL:2,CO2:2,BUN:2,CREATININE:2,GLUCOSE:2,PT:2,INR:2,CALCIUM,2: in the last 72 hours  Discharge Medications:   Medication List  As of 03/11/2012  7:44 AM   STOP taking these medications         BC HEADACHE POWDER PO      HYDROcodone-acetaminophen 5-325 MG per tablet         TAKE these medications         cetirizine 10 MG tablet   Commonly known as: ZYRTEC   Take 10 mg by mouth daily.      diazepam 5 MG tablet   Commonly known as: VALIUM   Take 1 tablet (5 mg total) by mouth every 8 (eight) hours as needed for anxiety (Max 3 tablets daily as needed for spasm).      lisinopril 40 MG tablet   Commonly known as: PRINIVIL,ZESTRIL   Take 40 mg by mouth daily.      ondansetron 4 MG tablet   Commonly known as: ZOFRAN   Take 1 tablet (4 mg total) by mouth every 8 (eight) hours as needed for nausea. MAX 3 pills daily      oxyCODONE-acetaminophen 10-325 MG per  tablet   Commonly known as: PERCOCET   Take 1 tablet by mouth every 6 (six) hours as needed for pain. MAX 4 pills daily      polyethylene glycol packet   Commonly known as: MIRALAX / GLYCOLAX   Take 17 g by mouth daily. Take 1 packet daily until bowels become regular      varenicline 1 MG tablet   Commonly known as: CHANTIX   Take 1 mg by mouth 2 (two) times daily.            Diagnostic Studies: Dg Chest 2 View  03/03/2012  *RADIOLOGY REPORT*  Clinical Data: Preoperative evaluation.  History of hypertension.  CHEST - 2 VIEW  Comparison: None.  Findings: The cardiac silhouette is normal size and shape. Mediastinal and hilar contours appear normal.  There is flattening of the diaphragm on lateral image with generalized hyperinflation configuration suggesting element of obstructive pulmonary disease. No pulmonary infiltrates or nodules were evident. No pleural abnormality is evident. Bones appear average for age.  IMPRESSION: Generalized hyperinflation configuration.  No acute superimposed cardiopulmonary pleural abnormalities.  Original Report Authenticated By: Crawford Givens, M.D.   Dg Cervical Spine 2-3 Views  03/10/2012  *RADIOLOGY REPORT*  Clinical Data: S R tatus post cervical spine fusion.  CERVICAL SPINE - 2-3 VIEW  Comparison: 03/03/2012.  Findings: Postoperative changes of a ACDF from C3-C5 are noted. Anterior plate and screw fixation device is in place at these levels, and there are interbody cages at the C3-C4 and C4-C5 interspaces.  Alignment is anatomic.  Prevertebral soft tissues appear mildly swollen, which is within normal limits in the immediate postoperative state.  IMPRESSION: 1.  Postoperative changes of a ACDF at C3-C5, as above.  Original Report Authenticated By: Florencia Reasons, M.D.   Dg Cervical Spine 2-3 Views  03/10/2012  *RADIOLOGY REPORT*  Clinical Data: Cervical discectomy and fusion  CERVICAL SPINE - 2-3 VIEW  Comparison: 03/03/2012  Findings: Two C-arm images show  anterior cervical discectomy and fusion from C3-C5.  Interbody fusion material is in place.  There is an anterior plate with screw fixation.  IMPRESSION: Good appearance following ACDF C3-C5.  Original Report Authenticated By: Thomasenia Sales, M.D.   Dg Cervical Spine 2-3 Views  03/03/2012  *RADIOLOGY REPORT*  Clinical Data: Anterior cervical decompression and fusion.  CERVICAL SPINE - 2-3 VIEW  Comparison: None.  Findings: The prevertebral  soft tissues are normal.  The alignment is anatomic through T1.  There is no evidence of acute fracture or subluxation.  The disc spaces are preserved.  Carotid artery calcifications are noted bilaterally.  IMPRESSION: Unremarkable two-view cervical spine radiographs. Early carotid arterial calcifications are noted.  Original Report Authenticated By: Gerrianne Scale, M.D.    Discharge Orders    Future Orders Please Complete By Expires   Diet - low sodium heart healthy      Call MD / Call 911      Comments:   If you experience chest pain or shortness of breath, CALL 911 and be transported to the hospital emergency room.  If you develope a fever above 101 F, pus (white drainage) or increased drainage or redness at the wound, or calf pain, call your surgeon's office.   Constipation Prevention      Comments:   Drink plenty of fluids.  Prune juice may be helpful.  You may use a stool softener, such as Colace (over the counter) 100 mg twice a day.  Use MiraLax (over the counter) for constipation as needed.   Increase activity slowly as tolerated      Discharge instructions      Comments:   Keep incision clean and dry.  Leave steri strips in place.  May shower 5 days from surgery; pat to dry following shower.  May redress with clean, dry dressing if you would like.  Do not apply any lotion/cream/ointment to the incision.   Driving restrictions      Comments:   No driving for 2 weeks.  Dr Shon Baton will discuss addition driving restrictions at your first post-op visit  in 2 weeks.   Lifting restrictions      Comments:   No lifting anything greater than 5 pounds.  DO NOT reach overhead (above shoulder height).  NO bending, stooping or squatting.  Dr. Shon Baton will discuss additional lifting restrictions at your first post-op visit in 2 weeks.      Follow-up Information    Follow up with Alvy Beal, MD in 2 weeks.   Contact information:   [facility] [address], [address] [place] [address] [phone]          Discharge Plan:  discharge to Home   Disposition: stable at the time of discharge     Signed: Gwinda Maine for Dr. Venita Lick [facility] [phone] 03/11/2012, 7:44 AM

## 2012-03-11 NOTE — Progress Notes (Signed)
    Subjective: Procedure(s) (LRB): ANTERIOR CERVICAL DECOMPRESSION/DISCECTOMY FUSION 2 LEVELS (N/A) 1 Day Post-Op  Patient reports pain as 6 on 0-10 scale.  Reports decreased arm pain reports incisional neck pain   Positive void Negative bowel movement Positive flatus Negative chest pain or shortness of breath  Objective: Vital signs in last 24 hours: Temp:  [97 F (36.1 C)-98.2 F (36.8 C)] 98.2 F (36.8 C) (08/15 0658) Pulse Rate:  [84-103] 99  (08/15 0658) Resp:  [11-20] 18  (08/15 0658) BP: (101-142)/(66-93) 142/82 mmHg (08/15 0658) SpO2:  [93 %-99 %] 98 % (08/15 0658) Weight:  [103.5 kg (228 lb 2.8 oz)] 103.5 kg (228 lb 2.8 oz) (08/14 1543)  Intake/Output from previous day: 08/14 0701 - 08/15 0700 In: 4032.3 [P.O.:240; I.V.:3792.3] Out: 960 [Urine:960]  Labs: No results found for this basename: WBC:2,RBC:2,HCT:2,PLT:2 in the last 72 hours No results found for this basename: NA:2,K:2,CL:2,CO2:2,BUN:2,CREATININE:2,GLUCOSE:2,CALCIUM:2 in the last 72 hours No results found for this basename: LABPT:2,INR:2 in the last 72 hours  Physical Exam: Neurologically intact ABD soft Neurovascular intact Intact pulses distally Incision: dressing C/D/I, no drainage and no swelling Compartment soft  Assessment/Plan: Patient stable  xrays satisfactory Mobilization with physical therapy Encourage incentive spirometry Continue care  Advance diet Up with therapy Discharge home with home health Meds: change to valium for muscle spasms F/u 2 weeks  Venita Lick, MD [facility] [phone]

## 2012-03-11 NOTE — Evaluation (Signed)
Physical Therapy Evaluation and Discharge Patient Details Name: Billy Shaffer MRN: [id number] DOB: May 15, 1966 Today's Date: 03/11/2012 Time: 1027-1039 PT Time Calculation (min): 12 min  PT Assessment / Plan / Recommendation Clinical Impression  Pt is a 46 y/o male s/p cervical decompresssion /discetomy.  Pt educated on cervical precautions and demonstrated independence with all functional mobility.  No further acute PT needs.  Reviewed techniques for bathing and dressing while maintaining cervical precautions with pt. No acute OT needs identified.      PT Assessment  Patent does not need any further PT services    Follow Up Recommendations  No PT follow up    Barriers to Discharge        Equipment Recommendations  None recommended by PT    Recommendations for Other Services     Frequency      Precautions / Restrictions Precautions Precautions: Cervical Precaution Comments: Educated pt on cervical precautions and use of Neck brace.   Required Braces or Orthoses: Cervical Brace Restrictions Weight Bearing Restrictions: No Other Position/Activity Restrictions: no lifting anything heavier than 5lbs.     Pertinent Vitals/Pain No c/o pain       Mobility  Bed Mobility Bed Mobility: Rolling Right;Rolling Left;Right Sidelying to Sit;Sit to Sidelying Right Rolling Right: 7: Independent Rolling Left: 7: Independent Right Sidelying to Sit: 7: Independent Sit to Sidelying Right: 7: Independent Transfers Transfers: Sit to Stand;Stand to Sit Sit to Stand: 7: Independent;Without upper extremity assist;From bed Stand to Sit: 7: Independent;Without upper extremity assist;To bed Ambulation/Gait Ambulation/Gait Assistance: 7: Independent Ambulation Distance (Feet): 200 Feet Assistive device: None Gait Pattern: Within Functional Limits Gait velocity: wfl Stairs: Yes Stairs Assistance: 7: Independent Stair Management Technique: No rails Number of Stairs: 2  Wheelchair  Mobility Wheelchair Mobility: No    Exercises     PT Diagnosis:    PT Problem List:   PT Treatment Interventions:     PT Goals    Visit Information  Last PT Received On: 03/11/12    Subjective Data  Subjective: Pt agree to PT eval Patient Stated Goal: none stated   Prior Functioning  Home Living Lives With: Significant other Available Help at Discharge: Available 24 hours/day Type of Home: House Home Layout: One level Bathroom Shower/Tub: Tub/shower unit;Curtain Firefighter: Standard Bathroom Accessibility: No Home Adaptive Equipment: None Prior Function Level of Independence: Independent Able to Take Stairs?: Yes Driving: Yes Vocation: Unemployed Comments: Pt currently unemployed secondary to Cervical injury.  Communication Communication: No difficulties Dominant Hand: Right    Cognition  Overall Cognitive Status: Appears within functional limits for tasks assessed/performed Arousal/Alertness: Awake/alert Orientation Level: Appears intact for tasks assessed Behavior During Session: [facility] for tasks performed    Extremity/Trunk Assessment Right Upper Extremity Assessment RUE ROM/Strength/Tone: Within functional levels RUE Sensation: WFL - Light Touch;WFL - Proprioception RUE Coordination: WFL - gross motor;WFL - gross/fine motor Left Upper Extremity Assessment LUE ROM/Strength/Tone: Within functional levels LUE Sensation: WFL - Light Touch;WFL - Proprioception LUE Coordination: WFL - gross/fine motor Right Lower Extremity Assessment RLE ROM/Strength/Tone: Within functional levels RLE Sensation: WFL - Light Touch;WFL - Proprioception RLE Coordination: WFL - gross/fine motor Left Lower Extremity Assessment LLE ROM/Strength/Tone: Within functional levels LLE Sensation: WFL - Light Touch;WFL - Proprioception LLE Coordination: WFL - gross/fine motor Trunk Assessment Trunk Assessment: Normal   Balance Balance Balance Assessed: No  End of Session PT - End of  Session Equipment Utilized During Treatment: Gait belt Activity Tolerance: Patient tolerated treatment well Patient left: in chair;with  call bell/phone within reach Nurse Communication: Mobility status  GP Functional Assessment Tool Used: clinical judgement Functional Limitation: Mobility: Walking and moving around Mobility: Walking and Moving Around Current Status [phone]): 0 percent impaired, limited or restricted Mobility: Walking and Moving Around Goal Status [phone]): 0 percent impaired, limited or restricted Mobility: Walking and Moving Around Discharge Status [phone]): 0 percent impaired, limited or restricted   Christorpher Hisaw 03/11/2012, 10:52 AM  Theron Arista L. Marisabel Macpherson DPT [phone]

## 2012-03-12 ENCOUNTER — Encounter (HOSPITAL_COMMUNITY): Payer: Self-pay | Admitting: Orthopedic Surgery

## 2012-03-12 NOTE — Progress Notes (Signed)
Referral received for SNF. Chart reviewed:  PT recommends home with no needs.  Discussed with RNCM.  No CSW needs identified.  CSW to sign off. DC home today.  Lorri Frederick. [place]  [phone]

## 2012-03-16 ENCOUNTER — Encounter (HOSPITAL_COMMUNITY)
Admission: AD | Disposition: A | Discharge status: Home / Self Care | Payer: Self-pay | Source: Ambulatory Visit | Attending: Orthopedic Surgery

## 2012-03-16 ENCOUNTER — Encounter (HOSPITAL_COMMUNITY): Payer: Self-pay | Admitting: General Practice

## 2012-03-16 ENCOUNTER — Encounter (HOSPITAL_COMMUNITY): Payer: Self-pay | Admitting: Certified Registered Nurse Anesthetist

## 2012-03-16 ENCOUNTER — Inpatient Hospital Stay (HOSPITAL_COMMUNITY): Payer: BC Managed Care – PPO | Admitting: Certified Registered Nurse Anesthetist

## 2012-03-16 ENCOUNTER — Inpatient Hospital Stay (HOSPITAL_COMMUNITY)
Admission: AD | Admit: 2012-03-16 | Discharge: 2012-03-19 | DRG: 443 | Disposition: A | Discharge status: Home / Self Care | Payer: BC Managed Care – PPO | Source: Ambulatory Visit | Attending: Orthopedic Surgery | Admitting: Orthopedic Surgery

## 2012-03-16 ENCOUNTER — Inpatient Hospital Stay (HOSPITAL_COMMUNITY): Payer: BC Managed Care – PPO

## 2012-03-16 DIAGNOSIS — I10 Essential (primary) hypertension: Secondary | ICD-10-CM | POA: Diagnosis present

## 2012-03-16 DIAGNOSIS — R0602 Shortness of breath: Secondary | ICD-10-CM

## 2012-03-16 DIAGNOSIS — J45909 Unspecified asthma, uncomplicated: Secondary | ICD-10-CM | POA: Diagnosis present

## 2012-03-16 DIAGNOSIS — IMO0002 Reserved for concepts with insufficient information to code with codable children: Principal | ICD-10-CM | POA: Diagnosis present

## 2012-03-16 DIAGNOSIS — F411 Generalized anxiety disorder: Secondary | ICD-10-CM | POA: Diagnosis present

## 2012-03-16 DIAGNOSIS — Y838 Other surgical procedures as the cause of abnormal reaction of the patient, or of later complication, without mention of misadventure at the time of the procedure: Secondary | ICD-10-CM | POA: Diagnosis present

## 2012-03-16 HISTORY — DX: Unspecified asthma, uncomplicated: J45.909

## 2012-03-16 HISTORY — DX: Shortness of breath: R06.02

## 2012-03-16 HISTORY — PX: HEMATOMA EVACUATION: SHX5118

## 2012-03-16 LAB — GRAM STAIN

## 2012-03-16 SURGERY — EVACUATION HEMATOMA
Anesthesia: General | Site: Neck | Wound class: Clean

## 2012-03-16 MED ORDER — CEFAZOLIN SODIUM 1-5 GM-% IV SOLN
1.0000 g | Freq: Three times a day (TID) | INTRAVENOUS | Status: DC
  Administered 2012-03-16 – 2012-03-17 (×3): 1 g via INTRAVENOUS
  Filled 2012-03-16 (×5): qty 50

## 2012-03-16 MED ORDER — LACTATED RINGERS IV SOLN
INTRAVENOUS | Status: DC | PRN
  Administered 2012-03-16: 19:00:00 via INTRAVENOUS

## 2012-03-16 MED ORDER — METHOCARBAMOL 100 MG/ML IJ SOLN
500.0000 mg | Freq: Four times a day (QID) | INTRAVENOUS | Status: DC | PRN
  Filled 2012-03-16: qty 5

## 2012-03-16 MED ORDER — SUCCINYLCHOLINE CHLORIDE 20 MG/ML IJ SOLN
INTRAMUSCULAR | Status: DC | PRN
  Administered 2012-03-16: 120 mg via INTRAVENOUS

## 2012-03-16 MED ORDER — MENTHOL 3 MG MT LOZG: 1.0000 | LOZENGE | OROMUCOSAL | Status: DC | PRN

## 2012-03-16 MED ORDER — ONDANSETRON HCL 4 MG/2ML IJ SOLN: 4.0000 mg | INTRAMUSCULAR | Status: DC | PRN

## 2012-03-16 MED ORDER — DIPHENHYDRAMINE HCL 50 MG/ML IJ SOLN
12.5000 mg | Freq: Four times a day (QID) | INTRAMUSCULAR | Status: DC | PRN
  Administered 2012-03-16: 12.5 mg via INTRAVENOUS
  Filled 2012-03-16: qty 1

## 2012-03-16 MED ORDER — ZOLPIDEM TARTRATE 5 MG PO TABS
5.0000 mg | ORAL_TABLET | Freq: Every evening | ORAL | Status: DC | PRN
  Administered 2012-03-18: 5 mg via ORAL
  Filled 2012-03-16 (×2): qty 1

## 2012-03-16 MED ORDER — DOCUSATE SODIUM 100 MG PO CAPS
100.0000 mg | ORAL_CAPSULE | Freq: Two times a day (BID) | ORAL | Status: DC
  Administered 2012-03-16 – 2012-03-18 (×5): 100 mg via ORAL
  Filled 2012-03-16 (×8): qty 1

## 2012-03-16 MED ORDER — DEXAMETHASONE 4 MG PO TABS
4.0000 mg | ORAL_TABLET | Freq: Four times a day (QID) | ORAL | Status: DC
  Administered 2012-03-17 – 2012-03-18 (×5): 4 mg via ORAL
  Filled 2012-03-16 (×14): qty 1

## 2012-03-16 MED ORDER — ROCURONIUM BROMIDE 100 MG/10ML IV SOLN
INTRAVENOUS | Status: DC | PRN
  Administered 2012-03-16: 30 mg via INTRAVENOUS

## 2012-03-16 MED ORDER — ACETAMINOPHEN 10 MG/ML IV SOLN
1000.0000 mg | Freq: Once | INTRAVENOUS | Status: AC | PRN
  Filled 2012-03-16: qty 100

## 2012-03-16 MED ORDER — NEOSTIGMINE METHYLSULFATE 1 MG/ML IJ SOLN
INTRAMUSCULAR | Status: DC | PRN
  Administered 2012-03-16: 4 mg via INTRAVENOUS

## 2012-03-16 MED ORDER — GADOBENATE DIMEGLUMINE 529 MG/ML IV SOLN
20.0000 mL | Freq: Once | INTRAVENOUS | Status: AC
  Administered 2012-03-16: 20 mL via INTRAVENOUS

## 2012-03-16 MED ORDER — OXYCODONE HCL 5 MG PO TABS
10.0000 mg | ORAL_TABLET | Freq: Once | ORAL | Status: AC
  Administered 2012-03-16: 10 mg via ORAL
  Filled 2012-03-16: qty 2

## 2012-03-16 MED ORDER — PHENOL 1.4 % MT LIQD: 1.0000 | OROMUCOSAL | Status: DC | PRN

## 2012-03-16 MED ORDER — OXYCODONE HCL 5 MG PO TABS: 10.0000 mg | ORAL_TABLET | ORAL | Status: DC | PRN

## 2012-03-16 MED ORDER — SODIUM CHLORIDE 0.9 % IV SOLN: 250.0000 mL | INTRAVENOUS | Status: DC

## 2012-03-16 MED ORDER — SODIUM CHLORIDE 0.9 % IJ SOLN: 3.0000 mL | INTRAMUSCULAR | Status: DC | PRN

## 2012-03-16 MED ORDER — LACTATED RINGERS IV SOLN
INTRAVENOUS | Status: DC
  Administered 2012-03-16 – 2012-03-17 (×2): via INTRAVENOUS

## 2012-03-16 MED ORDER — CEFAZOLIN SODIUM 1-5 GM-% IV SOLN
1.0000 g | Freq: Three times a day (TID) | INTRAVENOUS | Status: DC
  Filled 2012-03-16 (×2): qty 50

## 2012-03-16 MED ORDER — DEXAMETHASONE SODIUM PHOSPHATE 4 MG/ML IJ SOLN
4.0000 mg | Freq: Four times a day (QID) | INTRAMUSCULAR | Status: DC
  Administered 2012-03-16 – 2012-03-18 (×4): 4 mg via INTRAVENOUS
  Filled 2012-03-16 (×14): qty 1

## 2012-03-16 MED ORDER — HYDROMORPHONE 0.3 MG/ML IV SOLN
INTRAVENOUS | Status: DC
  Administered 2012-03-16: 21:00:00 via INTRAVENOUS
  Administered 2012-03-17: 0.399 mg via INTRAVENOUS
  Administered 2012-03-17: 2.39 mg via INTRAVENOUS

## 2012-03-16 MED ORDER — SODIUM CHLORIDE 0.9 % IJ SOLN: 3.0000 mL | Freq: Two times a day (BID) | INTRAMUSCULAR | Status: DC

## 2012-03-16 MED ORDER — BUPIVACAINE-EPINEPHRINE 0.25% -1:200000 IJ SOLN: INTRAMUSCULAR | Status: DC | PRN

## 2012-03-16 MED ORDER — HYDROMORPHONE HCL PF 1 MG/ML IJ SOLN
0.2500 mg | INTRAMUSCULAR | Status: DC | PRN
  Administered 2012-03-16 (×3): 0.5 mg via INTRAVENOUS

## 2012-03-16 MED ORDER — LISINOPRIL 40 MG PO TABS
40.0000 mg | ORAL_TABLET | Freq: Every day | ORAL | Status: DC
  Administered 2012-03-17 – 2012-03-18 (×2): 40 mg via ORAL
  Filled 2012-03-16 (×3): qty 1

## 2012-03-16 MED ORDER — FENTANYL CITRATE 0.05 MG/ML IJ SOLN
INTRAMUSCULAR | Status: DC | PRN
  Administered 2012-03-16 (×3): 50 ug via INTRAVENOUS
  Administered 2012-03-16: 100 ug via INTRAVENOUS

## 2012-03-16 MED ORDER — CEFAZOLIN SODIUM-DEXTROSE 2-3 GM-% IV SOLR
2.0000 g | INTRAVENOUS | Status: AC
  Administered 2012-03-16: 2 g via INTRAVENOUS
  Filled 2012-03-16: qty 50

## 2012-03-16 MED ORDER — MORPHINE SULFATE 2 MG/ML IJ SOLN
1.0000 mg | INTRAMUSCULAR | Status: DC | PRN
  Administered 2012-03-17 (×3): 2 mg via INTRAVENOUS
  Filled 2012-03-16 (×3): qty 1

## 2012-03-16 MED ORDER — THROMBIN 20000 UNITS EX KIT
PACK | CUTANEOUS | Status: DC | PRN
  Administered 2012-03-16: 18:00:00 via TOPICAL

## 2012-03-16 MED ORDER — ONDANSETRON HCL 4 MG/2ML IJ SOLN: 4.0000 mg | Freq: Four times a day (QID) | INTRAMUSCULAR | Status: DC | PRN

## 2012-03-16 MED ORDER — SODIUM CHLORIDE 0.9 % IJ SOLN
3.0000 mL | Freq: Two times a day (BID) | INTRAMUSCULAR | Status: DC
  Administered 2012-03-16: 3 mL via INTRAVENOUS

## 2012-03-16 MED ORDER — LIDOCAINE HCL (CARDIAC) 20 MG/ML IV SOLN
INTRAVENOUS | Status: DC | PRN
  Administered 2012-03-16: 50 mg via INTRAVENOUS

## 2012-03-16 MED ORDER — FLEET ENEMA 7-19 GM/118ML RE ENEM: 1.0000 | ENEMA | Freq: Once | RECTAL | Status: AC | PRN

## 2012-03-16 MED ORDER — DIPHENHYDRAMINE HCL 12.5 MG/5ML PO ELIX: 12.5000 mg | ORAL_SOLUTION | Freq: Four times a day (QID) | ORAL | Status: DC | PRN

## 2012-03-16 MED ORDER — ALBUTEROL SULFATE HFA 108 (90 BASE) MCG/ACT IN AERS
2.0000 | INHALATION_SPRAY | RESPIRATORY_TRACT | Status: DC | PRN
  Administered 2012-03-18: 2 via RESPIRATORY_TRACT
  Filled 2012-03-16: qty 6.7

## 2012-03-16 MED ORDER — PNEUMOCOCCAL VAC POLYVALENT 25 MCG/0.5ML IJ INJ: 0.5000 mL | INJECTION | INTRAMUSCULAR | Status: DC

## 2012-03-16 MED ORDER — OXYCODONE HCL 5 MG PO TABS
10.0000 mg | ORAL_TABLET | ORAL | Status: DC | PRN
  Administered 2012-03-17 (×3): 10 mg via ORAL
  Filled 2012-03-16 (×3): qty 2

## 2012-03-16 MED ORDER — ZOLPIDEM TARTRATE 5 MG PO TABS
5.0000 mg | ORAL_TABLET | Freq: Every evening | ORAL | Status: DC | PRN
  Administered 2012-03-16 – 2012-03-18 (×2): 5 mg via ORAL
  Filled 2012-03-16: qty 1

## 2012-03-16 MED ORDER — ONDANSETRON HCL 4 MG/2ML IJ SOLN
INTRAMUSCULAR | Status: DC | PRN
  Administered 2012-03-16: 4 mg via INTRAVENOUS

## 2012-03-16 MED ORDER — GLYCOPYRROLATE 0.2 MG/ML IJ SOLN
INTRAMUSCULAR | Status: DC | PRN
  Administered 2012-03-16: 0.4 mg via INTRAVENOUS

## 2012-03-16 MED ORDER — ONDANSETRON HCL 4 MG/2ML IJ SOLN: 4.0000 mg | Freq: Once | INTRAMUSCULAR | Status: AC | PRN

## 2012-03-16 MED ORDER — SODIUM CHLORIDE 0.9 % IJ SOLN: 9.0000 mL | INTRAMUSCULAR | Status: DC | PRN

## 2012-03-16 MED ORDER — HEMOSTATIC AGENTS (NO CHARGE) OPTIME
TOPICAL | Status: DC | PRN
  Administered 2012-03-16: 1 via TOPICAL

## 2012-03-16 MED ORDER — PROPOFOL 10 MG/ML IV EMUL
INTRAVENOUS | Status: DC | PRN
  Administered 2012-03-16: 200 mg via INTRAVENOUS

## 2012-03-16 MED ORDER — LACTATED RINGERS IV SOLN: INTRAVENOUS | Status: DC

## 2012-03-16 MED ORDER — 0.9 % SODIUM CHLORIDE (POUR BTL) OPTIME
TOPICAL | Status: DC | PRN
  Administered 2012-03-16: 1000 mL

## 2012-03-16 MED ORDER — LACTATED RINGERS IV SOLN
INTRAVENOUS | Status: DC
  Administered 2012-03-16 (×2): via INTRAVENOUS

## 2012-03-16 MED ORDER — HYDROMORPHONE HCL PF 1 MG/ML IJ SOLN
INTRAMUSCULAR | Status: AC
  Administered 2012-03-16: 0.5 mg via INTRAVENOUS
  Filled 2012-03-16: qty 1

## 2012-03-16 MED ORDER — MORPHINE SULFATE 2 MG/ML IJ SOLN
2.0000 mg | INTRAMUSCULAR | Status: DC | PRN
  Administered 2012-03-16 (×2): 2 mg via INTRAVENOUS
  Filled 2012-03-16 (×2): qty 1

## 2012-03-16 MED ORDER — MIDAZOLAM HCL 5 MG/5ML IJ SOLN
INTRAMUSCULAR | Status: DC | PRN
  Administered 2012-03-16: 2 mg via INTRAVENOUS

## 2012-03-16 MED ORDER — NALOXONE HCL 0.4 MG/ML IJ SOLN: 0.4000 mg | INTRAMUSCULAR | Status: DC | PRN

## 2012-03-16 MED ORDER — METHOCARBAMOL 500 MG PO TABS: 500.0000 mg | ORAL_TABLET | Freq: Four times a day (QID) | ORAL | Status: DC | PRN

## 2012-03-16 MED ORDER — METHOCARBAMOL 500 MG PO TABS
500.0000 mg | ORAL_TABLET | Freq: Four times a day (QID) | ORAL | Status: DC | PRN
  Administered 2012-03-17 – 2012-03-18 (×5): 500 mg via ORAL
  Filled 2012-03-16 (×5): qty 1

## 2012-03-16 SURGICAL SUPPLY — 50 items
BLADE SURG ROTATE 9660 (MISCELLANEOUS) IMPLANT
BUR EGG ELITE 4.0 (BURR) IMPLANT
BUR MATCHSTICK NEURO 3.0 LAGG (BURR) IMPLANT
CANISTER SUCTION 2500CC (MISCELLANEOUS) ×3 IMPLANT
CLOTH BEACON ORANGE TIMEOUT ST (SAFETY) ×3 IMPLANT
CLSR STERI-STRIP ANTIMIC 1/2X4 (GAUZE/BANDAGES/DRESSINGS) ×3 IMPLANT
CORDS BIPOLAR (ELECTRODE) ×3 IMPLANT
COVER SURGICAL LIGHT HANDLE (MISCELLANEOUS) ×6 IMPLANT
CRADLE DONUT ADULT HEAD (MISCELLANEOUS) ×3 IMPLANT
DERMABOND ADVANCED (GAUZE/BANDAGES/DRESSINGS)
DERMABOND ADVANCED .7 DNX12 (GAUZE/BANDAGES/DRESSINGS) IMPLANT
DRAPE C-ARM 42X72 X-RAY (DRAPES) ×3 IMPLANT
DRAPE POUCH INSTRU U-SHP 10X18 (DRAPES) ×3 IMPLANT
DRAPE SURG 17X23 STRL (DRAPES) ×3 IMPLANT
DRAPE U-SHAPE 47X51 STRL (DRAPES) ×3 IMPLANT
DRSG MEPILEX BORDER 4X4 (GAUZE/BANDAGES/DRESSINGS) IMPLANT
DURAPREP 26ML APPLICATOR (WOUND CARE) ×3 IMPLANT
ELECT COATED BLADE 2.86 ST (ELECTRODE) ×3 IMPLANT
ELECT REM PT RETURN 9FT ADLT (ELECTROSURGICAL) ×3
ELECTRODE REM PT RTRN 9FT ADLT (ELECTROSURGICAL) ×2 IMPLANT
GLOVE BIOGEL PI IND STRL 6.5 (GLOVE) ×2 IMPLANT
GLOVE BIOGEL PI IND STRL 8.5 (GLOVE) ×2 IMPLANT
GLOVE BIOGEL PI INDICATOR 6.5 (GLOVE) ×1
GLOVE BIOGEL PI INDICATOR 8.5 (GLOVE) ×1
GLOVE ECLIPSE 6.0 STRL STRAW (GLOVE) ×3 IMPLANT
GLOVE ECLIPSE 8.5 STRL (GLOVE) ×3 IMPLANT
GOWN PREVENTION PLUS XXLARGE (GOWN DISPOSABLE) ×3 IMPLANT
GOWN STRL NON-REIN LRG LVL3 (GOWN DISPOSABLE) ×6 IMPLANT
KIT BASIN OR (CUSTOM PROCEDURE TRAY) ×3 IMPLANT
KIT ROOM TURNOVER OR (KITS) ×3 IMPLANT
NEEDLE SPNL 18GX3.5 QUINCKE PK (NEEDLE) ×3 IMPLANT
NS IRRIG 1000ML POUR BTL (IV SOLUTION) ×3 IMPLANT
PACK ORTHO CERVICAL (CUSTOM PROCEDURE TRAY) ×3 IMPLANT
PACK UNIVERSAL I (CUSTOM PROCEDURE TRAY) ×3 IMPLANT
PAD ARMBOARD 7.5X6 YLW CONV (MISCELLANEOUS) ×6 IMPLANT
SPONGE INTESTINAL PEANUT (DISPOSABLE) ×3 IMPLANT
SPONGE SURGIFOAM ABS GEL 100 (HEMOSTASIS) ×3 IMPLANT
STRIP CLOSURE SKIN 1/2X4 (GAUZE/BANDAGES/DRESSINGS) IMPLANT
SURGIFLO TRUKIT (HEMOSTASIS) ×3 IMPLANT
SUT MNCRL AB 3-0 PS2 18 (SUTURE) ×3 IMPLANT
SUT SILK 2 0 (SUTURE)
SUT SILK 2-0 18XBRD TIE 12 (SUTURE) IMPLANT
SUT VIC AB 2-0 CT1 18 (SUTURE) ×3 IMPLANT
SYR BULB IRRIGATION 50ML (SYRINGE) ×3 IMPLANT
SYR CONTROL 10ML LL (SYRINGE) ×3 IMPLANT
TAPE CLOTH 4X10 WHT NS (GAUZE/BANDAGES/DRESSINGS) ×3 IMPLANT
TAPE UMBILICAL COTTON 1/8X30 (MISCELLANEOUS) ×3 IMPLANT
TOWEL OR 17X24 6PK STRL BLUE (TOWEL DISPOSABLE) ×3 IMPLANT
TOWEL OR 17X26 10 PK STRL BLUE (TOWEL DISPOSABLE) ×3 IMPLANT
WATER STERILE IRR 1000ML POUR (IV SOLUTION) ×3 IMPLANT

## 2012-03-16 NOTE — H&P (Signed)
Provider Not In System Chief Complaint: hematoma History: Patient 1 week s/p multilevel ACDF for cervical spondylotic disease.  Patient presents to office for swelling and pain.  Noted to have significant swelling at incision site.  Due to potential risk of airway compromise will admit for MRI and evacuation of hematoma.    Past Medical History  Diagnosis Date  . Hypertension     takes med  . Anxiety     impending surgery  . Bronchitis, allergic     usese inhaler at times Ventolin  . Asthma   . Shortness of breath 03/16/2012    "right now; cause of neck pain"    Allergies  Allergen Reactions  . Vicodin (Hydrocodone-Acetaminophen) Itching    No current facility-administered medications on file prior to encounter.   Current Outpatient Prescriptions on File Prior to Encounter  Medication Sig Dispense Refill  . cetirizine (ZYRTEC) 10 MG tablet Take 10 mg by mouth daily.      . diazepam (VALIUM) 5 MG tablet Take 1 tablet (5 mg total) by mouth every 8 (eight) hours as needed for anxiety (Max 3 tablets daily as needed for spasm).  42 tablet  0  . lisinopril (PRINIVIL,ZESTRIL) 40 MG tablet Take 40 mg by mouth daily.      . ondansetron (ZOFRAN) 4 MG tablet Take 1 tablet (4 mg total) by mouth every 8 (eight) hours as needed for nausea. MAX 3 pills daily  30 tablet  0  . oxyCODONE-acetaminophen (PERCOCET) 10-325 MG per tablet Take 1 tablet by mouth every 6 (six) hours as needed for pain. MAX 4 pills daily  56 tablet  0  . varenicline (CHANTIX) 1 MG tablet Take 1 mg by mouth 2 (two) times daily.        Physical Exam: Filed Vitals:   03/16/12 1000  BP: 123/83  Pulse: 97  Temp: 98.5 F (36.9 C)  Resp: 16   No SOB/CP at present.  No signs of stridor or labored breathing positive difficult swallowing Significant swelling at incision site - tender to palpation Neuro: no focal motor deficits.  Sensation to light touch intact throughout Pain with neck palpation and ROM Ambulating  without difficulty Negative babinski. No clonus  A/P:  Patient returns to office today with significant left lateral neck swelling.  Also notes difficulty breathing last 2 nights.  Incision does not show signs of infection, but I do have concerns for expanding hematoma.   Even though the patient and his wife indicate that the swelling has decreased since Sunday evening, I am still worried about potential airway compromise due to a hematoma. As a result patient was direct admitted from my office this AM. Stat MRI with contrast has been ordered to further detail the hematoma Plan on wound exploration this afternoon Patient and wife are in agreement with the plan - all their questions/concerns were addressed.

## 2012-03-16 NOTE — Transfer of Care (Signed)
Immediate Anesthesia Transfer of Care Note  Patient: Billy Shaffer  Procedure(s) Performed: Procedure(s) (LRB): EVACUATION HEMATOMA (N/A)  Patient Location: PACU  Anesthesia Type: General  Level of Consciousness: awake, alert , oriented and patient cooperative  Airway & Oxygen Therapy: Patient Spontanous Breathing and Patient connected to nasal cannula oxygen  Post-op Assessment: Report given to PACU RN, Post -op Vital signs reviewed and stable and Patient moving all extremities X 4  Post vital signs: Reviewed and stable  Complications: No apparent anesthesia complications

## 2012-03-16 NOTE — Anesthesia Postprocedure Evaluation (Signed)
  Anesthesia Post-op Note  Patient: Billy Shaffer  Procedure(s) Performed: Procedure(s) (LRB): EVACUATION HEMATOMA (N/A)  Patient Location: PACU  Anesthesia Type: General  Level of Consciousness: awake, alert  and oriented  Airway and Oxygen Therapy: Patient Spontanous Breathing and Patient connected to nasal cannula oxygen  Post-op Pain: mild  Post-op Assessment: Post-op Vital signs reviewed and Patient's Cardiovascular Status Stable  Post-op Vital Signs: stable  Complications: No apparent anesthesia complications

## 2012-03-16 NOTE — Progress Notes (Signed)
Patient's on call antibiotic was not up here to send with patient. SCD were sent to OR with patient on own bed from room

## 2012-03-16 NOTE — Anesthesia Preprocedure Evaluation (Addendum)
Anesthesia Evaluation  Patient identified by MRN, date of birth, ID band Patient awake    Reviewed: Allergy & Precautions, H&P , NPO status , Patient's Chart, lab work & pertinent test results  Airway Mallampati: III TM Distance: >3 FB     Dental  (+) Teeth Intact and Dental Advisory Given   Pulmonary shortness of breath and with exertion, asthma , former smoker,  breath sounds clear to auscultation        Cardiovascular hypertension, Pt. on medications Rhythm:Regular Rate:Normal     Neuro/Psych    GI/Hepatic   Endo/Other    Renal/GU      Musculoskeletal   Abdominal   Peds  Hematology   Anesthesia Other Findings   Reproductive/Obstetrics                          Anesthesia Physical Anesthesia Plan  ASA: III  Anesthesia Plan: General   Post-op Pain Management:    Induction: Intravenous  Airway Management Planned: Oral ETT  Additional Equipment:   Intra-op Plan:   Post-operative Plan: Extubation in OR  Informed Consent: I have reviewed the patients History and Physical, chart, labs and discussed the procedure including the risks, benefits and alternatives for the proposed anesthesia with the patient or authorized representative who has indicated his/her understanding and acceptance.   Dental advisory given  Plan Discussed with: CRNA and Surgeon  Anesthesia Plan Comments: (46 y/o WM 6 days S/P ACDF now with post-op hematoma, voice changes but no evidence of airway compromise htn H/O smoking h/o asthma lungs clear  Plan GA with glide scope  Kipp Brood, MD)        Anesthesia Quick Evaluation

## 2012-03-16 NOTE — Progress Notes (Signed)
Bedside report given to [facility], CRNA. Patient alert and oriented. Skin warm and dry.

## 2012-03-16 NOTE — Brief Op Note (Signed)
03/16/2012  8:00 PM  PATIENT:  Billy Shaffer  46 y.o. male  PRE-OPERATIVE DIAGNOSIS:  hematoma  POST-OPERATIVE DIAGNOSIS:  hematoma  PROCEDURE:  Procedure(s) (LRB): EVACUATION HEMATOMA (N/A)  SURGEON:  Surgeon(s) and Role:    * Venita Lick, MD - Primary  PHYSICIAN ASSISTANT:   ASSISTANTS: none   ANESTHESIA:   general  EBL:  Total I/O In: 1300 [I.V.:1300] Out: -   BLOOD ADMINISTERED:none  DRAINS: 1 hemovac in the neck  LOCAL MEDICATIONS USED:  NONE  SPECIMEN:  Source of Specimen:  clutures taken  DISPOSITION OF SPECIMEN:  microbiology  COUNTS:  YES  TOURNIQUET:  * No tourniquets in log *  DICTATION: .Other Dictation: Dictation Number [id number]  PLAN OF CARE: Admit to inpatient   PATIENT DISPOSITION:  PACU - hemodynamically stable.

## 2012-03-17 ENCOUNTER — Inpatient Hospital Stay (HOSPITAL_COMMUNITY): Payer: BC Managed Care – PPO

## 2012-03-17 MED ORDER — ONDANSETRON HCL 4 MG/2ML IJ SOLN: 4.0000 mg | Freq: Four times a day (QID) | INTRAMUSCULAR | Status: DC | PRN

## 2012-03-17 MED ORDER — HYDROMORPHONE HCL PF 1 MG/ML IJ SOLN
1.0000 mg | Freq: Once | INTRAMUSCULAR | Status: AC
  Administered 2012-03-17: 1 mg via INTRAVENOUS

## 2012-03-17 MED ORDER — SODIUM CHLORIDE 0.9 % IJ SOLN: 9.0000 mL | INTRAMUSCULAR | Status: DC | PRN

## 2012-03-17 MED ORDER — HYDROMORPHONE HCL PF 1 MG/ML IJ SOLN
INTRAMUSCULAR | Status: AC
  Administered 2012-03-17: 1 mg
  Filled 2012-03-17: qty 1

## 2012-03-17 MED ORDER — HYDROMORPHONE 0.3 MG/ML IV SOLN
INTRAVENOUS | Status: DC
  Administered 2012-03-17: 17:00:00 via INTRAVENOUS
  Administered 2012-03-17: 4.999 mg via INTRAVENOUS
  Administered 2012-03-17: 23:00:00 via INTRAVENOUS
  Administered 2012-03-18: 4.36 mg via INTRAVENOUS
  Administered 2012-03-18: 3.39 mg via INTRAVENOUS
  Filled 2012-03-17 (×2): qty 25

## 2012-03-17 MED ORDER — DIPHENHYDRAMINE HCL 12.5 MG/5ML PO ELIX: 12.5000 mg | ORAL_SOLUTION | Freq: Four times a day (QID) | ORAL | Status: DC | PRN

## 2012-03-17 MED ORDER — DIPHENHYDRAMINE HCL 50 MG/ML IJ SOLN: 12.5000 mg | Freq: Four times a day (QID) | INTRAMUSCULAR | Status: DC | PRN

## 2012-03-17 MED ORDER — DIAZEPAM 5 MG PO TABS: 5.0000 mg | ORAL_TABLET | Freq: Three times a day (TID) | ORAL | Status: DC | PRN

## 2012-03-17 MED ORDER — NALOXONE HCL 0.4 MG/ML IJ SOLN: 0.4000 mg | INTRAMUSCULAR | Status: DC | PRN

## 2012-03-17 NOTE — Op Note (Signed)
NAME:  Billy Shaffer, Billy Shaffer NO.:  [id number]  MEDICAL RECORD NO.:  [id number]  LOCATION:  [id number]                        FACILITY:  [facility]  PHYSICIAN:  Alvy Beal, MD    DATE OF BIRTH:  06-07-1966  DATE OF PROCEDURE:  03/16/2012 DATE OF DISCHARGE:                              OPERATIVE REPORT   PREOPERATIVE DIAGNOSIS:  Hematoma collection status post anterior cervical diskectomy and fusion, C3-4 and C4-5.  POSTOPERATIVE DIAGNOSIS:  Hematoma collection status post anterior cervical diskectomy and fusion, C3-4 and C4-5.  OPERATIVE PROCEDURE:  Evacuation of hematoma.  COMPLICATIONS:  None.  INTRAOPERATIVE FINDINGS:  Serosanguineous fluid collection that was evacuated.  Cultures were taken prior to administration of antibiotics. No evidence of active bleeding.  Hyperemic inflamed soft tissue.  No hardware complication.  HISTORY:  This is a very pleasant gentleman, who last week underwent an anterior cervical diskectomy and fusion at C3-4 and C4-5.  His postoperative course was uneventful.  He was discharged to home on the following day from surgery.  The patient presented to my office today because of significant swelling at the incision site.  He denied any history of fevers, chills, but just difficulty breathing at night when sleeping and the significant swelling at the incision site.  According to his wife, the swelling had actually gone down somewhat.  As a result of the fluid collection and concern over potential airway complication, the decision was made to admit the patient for surgical evacuation of the hematoma.  OPERATIVE NOTE:  The patient was brought to the operating room, placed supine on the operating table.  After successful induction of general anesthesia and endotracheal intubation, the anterior cervical spine was prepped and draped.  TEDs and SCDs were applied.  After the prepping and draping, a time-out was done confirming patient, procedure,  and all other pertinent important data.  At this point in time, the skin suture was removed, and the skin incision was re-incised.  I then bluntly dissected down to the platysma and blunt instruments, I dissected through the platysma releasing and removing the Vicryl suture that was used to close it.  As soon as I did this, I saw more of a serosanguineous fluid collection.  It did not appear to be grossly purulent nor did have a purulent odor.  Aerobic and anaerobic cultures were taken.  Once the cultures were taken, 1 g of Ancef was administered to the patient.  At this point, once I had the platysma opened, I then gently retracted the trachea and esophagus, and palpated down with my finger into the anterior cervical spine.  I evacuated the serosanguineous material and then inspected for bleeding.  There was no active hemorrhage noted.  I then copiously irrigated the wound with 2 L of irrigation.  Once this was done, I then reinspected for bleeding.  I then placed a surgical FloSeal into the wound and allowed it to stay for about 5 minutes.  I then irrigated out the FloSeal and again observed the wound for another 5-10 minutes.  Once that was assured there was no active bleeding, I then placed a drain through a separate stab incision.  This drain  tracked through the approach site down to the cervical plate.  I also inspected the plate itself.  The plate was secured.  There was no evidence of any loosening of the hardware.  At this point, I returned the trachea and esophagus to midline, and then closed the platysma with interrupted 2-0 Vicryl sutures and a 3-0 Monocryl for the skin.  To prevent inadvertent removal of the drain, I also sutured the drain in with a 2-0 nylon suture.  Once this was done, I then placed a Steri- Strips, and a bulky dry dressing were applied.  The Aspen collar was applied and the patient was extubated and transferred to PACU without incident.  At the end of  the case, all needle and sponge counts were correct.  There was no adverse intraoperative events.     Alvy Beal, MD     DDB/MEDQ  D:  03/16/2012  T:  03/17/2012  Job:  [id number]

## 2012-03-17 NOTE — Evaluation (Signed)
Physical Therapy Evaluation Patient Details Name: Billy Shaffer MRN: [id number] DOB: 1965-08-25 Today's Date: 03/17/2012 Time: 0910-0930 PT Time Calculation (min): 20 min  PT Assessment / Plan / Recommendation Clinical Impression  Pt is 46 y/o male admitted for cervical hematoma removal from cervical decompression and discetomy performed 10/09/11.  Pt moving well and has no further PT needs and has met all goals.  PT will sign off.    PT Assessment  Patent does not need any further PT services    Follow Up Recommendations  No PT follow up    Barriers to Discharge        Equipment Recommendations  None recommended by PT    Recommendations for Other Services     Frequency      Precautions / Restrictions Precautions Precautions: Cervical Precaution Comments: Educated pt on cervical precautions and use of Neck brace.   Required Braces or Orthoses: Cervical Brace Cervical Brace: Hard collar;Applied in sitting position Restrictions Weight Bearing Restrictions: No Other Position/Activity Restrictions: no lifting anything heavier than 5lbs.     Pertinent Vitals/Pain 3/10 neck pain, request pain medications at end of session      Mobility  Transfers Transfers: Sit to Stand;Stand to Sit Sit to Stand: 7: Independent;Without upper extremity assist;From bed Stand to Sit: 7: Independent;Without upper extremity assist;To bed Ambulation/Gait Ambulation/Gait Assistance: 7: Independent Ambulation Distance (Feet): 300 Feet Assistive device: None Gait Pattern: Within Functional Limits Stairs: Yes Stairs Assistance: 7: Independent Stair Management Technique: No rails Number of Stairs: 3  Wheelchair Mobility Wheelchair Mobility: No    Exercises     PT Diagnosis:    PT Problem List:   PT Treatment Interventions:     PT Goals    Visit Information  Last PT Received On: 03/17/12    Subjective Data  Subjective: Pt ready to go home Patient Stated Goal: none stated   Prior  Functioning  Home Living Lives With: Significant other Available Help at Discharge: Available 24 hours/day Type of Home: House Home Access: Level entry Home Layout: One level Bathroom Shower/Tub: Tub/shower unit;Curtain Firefighter: Standard Bathroom Accessibility: No Home Adaptive Equipment: None Prior Function Level of Independence: Independent Able to Take Stairs?: Yes Driving: Yes Vocation: Unemployed Communication Communication: No difficulties Dominant Hand: Right    Cognition  Overall Cognitive Status: Appears within functional limits for tasks assessed/performed Arousal/Alertness: Awake/alert Orientation Level: Appears intact for tasks assessed Behavior During Session: [facility] for tasks performed    Extremity/Trunk Assessment Right Lower Extremity Assessment RLE ROM/Strength/Tone: Within functional levels Left Lower Extremity Assessment LLE ROM/Strength/Tone: Within functional levels   Balance    End of Session PT - End of Session Equipment Utilized During Treatment: Cervical collar;Gait belt Activity Tolerance: Patient tolerated treatment well Patient left: in chair;with call bell/phone within reach Nurse Communication: Mobility status  GP     Billy Shaffer 03/17/2012, 10:17 AM Jake Shark, PT DPT [phone]

## 2012-03-17 NOTE — Progress Notes (Signed)
    Subjective: Procedure(s) (LRB): EVACUATION HEMATOMA (N/A) 1 Day Post-Op  Patient reports incisional neck pain as 7 on 0-10 scale.  He has been out of bed and ambulatory about the 5th floor without problems. Positive void, negative bowel movement, positive flatus Negative chest pain or shortness of breath Discomfort with swallowing  Objective: Vital signs in last 24 hours: Temp:  [97.3 F (36.3 C)-98.2 F (36.8 C)] 97.6 F (36.4 C) (08/21 1421) Pulse Rate:  [75-95] 95  (08/21 1421) Resp:  [10-20] 20  (08/21 1421) BP: (120-135)/(62-98) 133/90 mmHg (08/21 1421) SpO2:  [93 %-100 %] 96 % (08/21 1421) FiO2 (%):  [28 %] 28 % (08/20 2013)  Intake/Output from previous day: 08/20 0701 - 08/21 0700 In: 2375 [P.O.:25; I.V.:2350] Out: 0   Labs: No results found for this basename: WBC:2,RBC:2,HCT:2,PLT:2 in the last 72 hours No results found for this basename: NA:2,K:2,CL:2,CO2:2,BUN:2,CREATININE:2,GLUCOSE:2,CALCIUM:2 in the last 72 hours No results found for this basename: LABPT:2,INR:2 in the last 72 hours  Physical Exam: Neurologically intact Neurovascular intact Intact pulses distally Incision: dressing C/D/I Drain no longer in place   Assessment/Plan: Patient stable; reports neck pain Mobilization as tolerated Encourage incentive spirometry Drain stitch removed; new dressings applied Continue care; will address medications issues  with Dr. Shon Baton (pain meds and antibiotics)   Gwinda [place] for Dr. Venita Lick [facility] [phone] 03/17/2012, 3:44 PM

## 2012-03-17 NOTE — Progress Notes (Signed)
Referral received for SNF. Chart reviewed and CSW has spoken with RNCM who indicates that patient is for DC to home; no d/c needs identified.  CSW to sign off. Please re-consult if CSW needs arise.  Lorri Frederick. [place]  [phone]

## 2012-03-18 ENCOUNTER — Encounter (HOSPITAL_COMMUNITY): Payer: Self-pay | Admitting: Orthopedic Surgery

## 2012-03-18 MED ORDER — OXYCODONE HCL 5 MG PO TABS
20.0000 mg | ORAL_TABLET | ORAL | Status: DC | PRN
  Administered 2012-03-18 – 2012-03-19 (×7): 20 mg via ORAL
  Filled 2012-03-18 (×7): qty 4

## 2012-03-18 MED ORDER — OXYCODONE HCL 5 MG PO TABS: 10.0000 mg | ORAL_TABLET | ORAL | Status: DC | PRN

## 2012-03-18 NOTE — Progress Notes (Signed)
    Subjective: Procedure(s) (LRB): EVACUATION HEMATOMA (N/A) 2 Days Post-Op  Patient reports pain as 4 on 0-10 scale.  Reports decreased arm pain reports incisional neck pain   Positive void Positive bowel movement Positive flatus Negative chest pain or shortness of breath  Objective: Vital signs in last 24 hours: Temp:  [97.6 F (36.4 C)-98.1 F (36.7 C)] 98.1 F (36.7 C) (08/22 0535) Pulse Rate:  [95-99] 99  (08/22 0535) Resp:  [16-20] 19  (08/22 0535) BP: (125-133)/(80-91) 125/80 mmHg (08/22 0535) SpO2:  [94 %-98 %] 94 % (08/22 0535)  Intake/Output from previous day: 08/21 0701 - 08/22 0700 In: 240 [P.O.:240] Out: -   Labs: No results found for this basename: WBC:2,RBC:2,HCT:2,PLT:2 in the last 72 hours No results found for this basename: NA:2,K:2,CL:2,CO2:2,BUN:2,CREATININE:2,GLUCOSE:2,CALCIUM:2 in the last 72 hours No results found for this basename: LABPT:2,INR:2 in the last 72 hours  Physical Exam: Neurologically intact ABD soft Neurovascular intact Incision: dressing C/D/I and no drainage Compartment soft no significant swelling at incision site.  no stridor or labored breathing.    Assessment/Plan: Patient stable  xrays reviewed images.  Positive swelling Mobilization with physical therapy Encourage incentive spirometry Continue care  Pain control is an issue - will change to oral medications today.   Advance diet Plan for discharge tomorrow No hematoma noted at time of surgery.  Positive soft tissue swelling.  Clinically patient without swelling at incision site.  Will continue to monitor for another 24 hrs - if no issue tomorrow than plan on d/c Friday.    Venita Lick, MD [facility] [phone]

## 2012-03-18 NOTE — Evaluation (Signed)
Occupational Therapy Evaluation Patient Details Name: Billy Shaffer MRN: [id number] DOB: October 28, 1965 Today's Date: 03/18/2012 Time: 7829-5621 OT Time Calculation (min): 9 min  OT Assessment / Plan / Recommendation Clinical Impression  Pt is 46 y/o male admitted for cervical hematoma removal from cervical decompression and discetomy performed 10/09/11. All education completed and pt at I/Mod I level of functioning. No further acute OT needs indicated at this time.    OT Assessment  Patient does not need any further OT services    Follow Up Recommendations  No OT follow up    Barriers to Discharge      Equipment Recommendations  None recommended by OT;None recommended by PT    Recommendations for Other Services    Frequency       Precautions / Restrictions Precautions Precautions: Cervical Precaution Comments: Educated pt on cervical precautions and use of Neck brace.   Required Braces or Orthoses: Cervical Brace Cervical Brace: Hard collar;Applied in sitting position Restrictions Weight Bearing Restrictions: No Other Position/Activity Restrictions: no lifting anything heavier than 5lbs.     Pertinent Vitals/Pain Pt denies any pain    ADL  Eating/Feeding: Simulated;Independent Where Assessed - Eating/Feeding: Edge of bed Grooming: Performed;Wash/dry hands;Wash/dry face;Independent Where Assessed - Grooming: Unsupported standing Upper Body Bathing: Simulated;Independent Where Assessed - Upper Body Bathing: Unsupported standing Lower Body Bathing: Simulated;Independent Where Assessed - Lower Body Bathing: Unsupported standing Upper Body Dressing: Performed;Independent Where Assessed - Upper Body Dressing: Unsupported sitting Lower Body Dressing: Simulated;Modified independent Where Assessed - Lower Body Dressing: Unsupported sit to stand Toilet Transfer: Performed;Independent Toilet Transfer Method: Sit to Barista: Regular height toilet Toileting -  Clothing Manipulation and Hygiene: Simulated;Independent Where Assessed - Toileting Clothing Manipulation and Hygiene: Sit to stand from 3-in-1 or toilet Tub/Shower Transfer: Simulated;Modified independent (hold to wall for balance) Tub/Shower Transfer Method: Ambulating Equipment Used:  (cervical brace) Transfers/Ambulation Related to ADLs: I with ambulation within room ADL Comments: All education completed and pt at I level.     OT Diagnosis:    OT Problem List:   OT Treatment Interventions:     OT Goals    Visit Information  Last OT Received On: 03/18/12 Assistance Needed: +1    Subjective Data  Subjective: I'm just ready to shower Patient Stated Goal: Return home   Prior Functioning  Vision/Perception  Home Living Lives With: Significant other Available Help at Discharge: Available 24 hours/day Type of Home: House Home Access: Level entry Home Layout: One level Bathroom Shower/Tub: Tub/shower unit;Curtain Firefighter: Standard Bathroom Accessibility: No Home Adaptive Equipment: None Prior Function Level of Independence: Independent Able to Take Stairs?: Yes Driving: Yes Vocation: Unemployed Communication Communication: No difficulties Dominant Hand: Right      Cognition  Overall Cognitive Status: Appears within functional limits for tasks assessed/performed Arousal/Alertness: Awake/alert Orientation Level: Appears intact for tasks assessed Behavior During Session: [facility] for tasks performed    Extremity/Trunk Assessment Right Upper Extremity Assessment RUE ROM/Strength/Tone: Within functional levels RUE Sensation: WFL - Light Touch;WFL - Proprioception RUE Coordination: WFL - gross motor;WFL - gross/fine motor Left Upper Extremity Assessment LUE ROM/Strength/Tone: Within functional levels LUE Sensation: WFL - Light Touch;WFL - Proprioception LUE Coordination: WFL - gross/fine motor   Mobility Transfers Sit to Stand: 7: Independent;Without upper  extremity assist;From bed;From toilet Stand to Sit: 7: Independent;Without upper extremity assist;To bed;To toilet   Exercise    Balance    End of Session OT - End of Session Equipment Utilized During Treatment: Gait belt;Cervical collar  Activity Tolerance: Patient tolerated treatment well Patient left: in bed;with call bell/phone within reach  GO     Gloriajean Okun 03/18/2012, 9:07 AM

## 2012-03-19 MED ORDER — POLYETHYLENE GLYCOL 3350 17 G PO PACK: 17.0000 g | PACK | Freq: Every day | ORAL | Status: AC

## 2012-03-19 MED ORDER — OXYCODONE HCL 20 MG PO TABS: ORAL_TABLET | ORAL | Status: DC

## 2012-03-19 MED ORDER — METHOCARBAMOL 500 MG PO TABS: 500.0000 mg | ORAL_TABLET | Freq: Three times a day (TID) | ORAL | Status: AC

## 2012-03-19 MED ORDER — ONDANSETRON HCL 4 MG PO TABS: 4.0000 mg | ORAL_TABLET | Freq: Three times a day (TID) | ORAL | Status: DC | PRN

## 2012-03-19 NOTE — Discharge Summary (Signed)
Patient ID: Billy Shaffer MRN: [id number] DOB/AGE: 1966-01-23 46 y.o.  Admit date: 03/16/2012 Discharge date: 03/19/2012   Admission Diagnoses:  Post operative hematoma; cervical region  Discharge Diagnoses:   post operative cervical hematoma status post Procedure(s): EVACUATION HEMATOMA  Past Medical History  Diagnosis Date  . Hypertension     takes med  . Anxiety     impending surgery  . Bronchitis, allergic     usese inhaler at times Ventolin  . Asthma   . Shortness of breath 03/16/2012    "right now; cause of neck pain"    Surgeries: Procedure(s): EVACUATION HEMATOMA on 03/16/2012   Consultants: none  Discharged Condition: Improved  Hospital Course: Billy Shaffer is an 46 y.o. male who was admitted 03/16/2012 for operative treatment of post operative hematoma with swelling in the neck and difficulty breathing.  After pre-op clearance, the patient was taken to the operating room on 03/16/2012 and underwent  Procedure(s): EVACUATION HEMATOMA.    Patient was given perioperative antibiotics: Anti-infectives     Start     Dose/Rate Route Frequency Ordered Stop   03/17/12 0600   ceFAZolin (ANCEF) IVPB 2 g/50 mL premix        2 g 100 mL/hr over 30 Minutes Intravenous On call to O.R. 03/16/12 1236 03/16/12 1922   03/16/12 2230   ceFAZolin (ANCEF) IVPB 1 g/50 mL premix  Status:  Discontinued     Comments: For expanding hematoma      1 g 100 mL/hr over 30 Minutes Intravenous Every 8 hours 03/16/12 2229 03/17/12 1727   03/16/12 2130   ceFAZolin (ANCEF) IVPB 1 g/50 mL premix  Status:  Discontinued        1 g 100 mL/hr over 30 Minutes Intravenous Every 8 hours 03/16/12 2127 03/16/12 2230   03/16/12 2130   ceFAZolin (ANCEF) IVPB 1 g/50 mL premix  Status:  Discontinued     Comments: For expanding hematoma      1 g 100 mL/hr over 30 Minutes Intravenous Every 8 hours 03/16/12 2127 03/16/12 2216           Patient was given sequential compression devices and early  ambulation to prevent DVT.   Patient benefited maximally from hospital stay and there were no complications. At the time of discharge, the patient was urinating/moving their bowels without difficulty, tolerating a regular diet, pain is controlled with oral pain medications and they have been cleared by PT/OT.   Recent vital signs: Patient Vitals for the past 24 hrs:  BP Temp Temp src Pulse Resp SpO2  03/18/12 2200 120/75 mmHg 98.3 F (36.8 C) Oral 88  20  98 %  03/18/12 1456 105/74 mmHg 98.3 F (36.8 C) - 98  20  98 %     Recent laboratory studies: No results found for this basename: WBC:2,HGB:2,HCT:2,PLT:2,NA:2,K:2,CL:2,CO2:2,BUN:2,CREATININE:2,GLUCOSE:2,PT:2,INR:2,CALCIUM,2: in the last 72 hours   Discharge Medications:   Medication List  As of 03/19/2012  5:36 AM   STOP taking these medications         diazepam 5 MG tablet      oxyCODONE-acetaminophen 10-325 MG per tablet         TAKE these medications         albuterol 108 (90 BASE) MCG/ACT inhaler   Commonly known as: PROVENTIL HFA;VENTOLIN HFA   Inhale 2 puffs into the lungs every 4 (four) hours as needed. For shortness of breath      cetirizine 10 MG tablet  Commonly known as: ZYRTEC   Take 10 mg by mouth daily.      lisinopril 40 MG tablet   Commonly known as: PRINIVIL,ZESTRIL   Take 40 mg by mouth daily.      methocarbamol 500 MG tablet   Commonly known as: ROBAXIN   Take 1 tablet (500 mg total) by mouth 3 (three) times daily. MAX 3 pills daily      ondansetron 4 MG tablet   Commonly known as: ZOFRAN   Take 1 tablet (4 mg total) by mouth every 8 (eight) hours as needed for nausea. MAX 3 pills daily      polyethylene glycol packet   Commonly known as: MIRALAX / GLYCOLAX   Take 17 g by mouth daily. Take 1 packet daily until bowels become regular            Diagnostic Studies: Dg Chest 2 View  03/03/2012  *RADIOLOGY REPORT*  Clinical Data: Preoperative evaluation.  History of hypertension.  CHEST - 2 VIEW   Comparison: None.  Findings: The cardiac silhouette is normal size and shape. Mediastinal and hilar contours appear normal.  There is flattening of the diaphragm on lateral image with generalized hyperinflation configuration suggesting element of obstructive pulmonary disease. No pulmonary infiltrates or nodules were evident. No pleural abnormality is evident. Bones appear average for age.  IMPRESSION: Generalized hyperinflation configuration.  No acute superimposed cardiopulmonary pleural abnormalities.  Original Report Authenticated By: Crawford Givens, M.D.   Dg Cervical Spine 2-3 Views  03/17/2012  *RADIOLOGY REPORT*  Clinical Data: Postop hematoma  CERVICAL SPINE - 2-3 VIEW  Comparison: Cervical spine films of 03/10/2012  Findings: Anterior fusion from C3-C5 is again noted.  There is prominent prevertebral soft tissue swelling consistent with edema and/or hematoma.  The cervical vertebrae are in normal aligned.  IMPRESSION: Prominent prevertebral soft tissues at the operative site consistent with edema or postop hematoma.   Original Report Authenticated By: Juline Patch, M.D.    Dg Cervical Spine 2-3 Views  03/10/2012  *RADIOLOGY REPORT*  Clinical Data: S R tatus post cervical spine fusion.  CERVICAL SPINE - 2-3 VIEW  Comparison: 03/03/2012.  Findings: Postoperative changes of a ACDF from C3-C5 are noted. Anterior plate and screw fixation device is in place at these levels, and there are interbody cages at the C3-C4 and C4-C5 interspaces.  Alignment is anatomic.  Prevertebral soft tissues appear mildly swollen, which is within normal limits in the immediate postoperative state.  IMPRESSION: 1.  Postoperative changes of a ACDF at C3-C5, as above.  Original Report Authenticated By: Florencia Reasons, M.D.   Dg Cervical Spine 2-3 Views  03/10/2012  *RADIOLOGY REPORT*  Clinical Data: Cervical discectomy and fusion  CERVICAL SPINE - 2-3 VIEW  Comparison: 03/03/2012  Findings: Two C-arm images show anterior  cervical discectomy and fusion from C3-C5.  Interbody fusion material is in place.  There is an anterior plate with screw fixation.  IMPRESSION: Good appearance following ACDF C3-C5.  Original Report Authenticated By: Thomasenia Sales, M.D.   Dg Cervical Spine 2-3 Views  03/03/2012  *RADIOLOGY REPORT*  Clinical Data: Anterior cervical decompression and fusion.  CERVICAL SPINE - 2-3 VIEW  Comparison: None.  Findings: The prevertebral soft tissues are normal.  The alignment is anatomic through T1.  There is no evidence of acute fracture or subluxation.  The disc spaces are preserved.  Carotid artery calcifications are noted bilaterally.  IMPRESSION: Unremarkable two-view cervical spine radiographs. Early carotid arterial calcifications are noted.  Original Report Authenticated By: Gerrianne Scale, M.D.   Mr Neck Soft Tissue Only W Wo Contrast  03/16/2012  **ADDENDUM** CREATED: 03/16/2012 17:25:11  Results called to Dr.  Rennis Chris 03/16/2012 5:25 p.m.  **END ADDENDUM** SIGNED BY: Almedia Balls. Constance Goltz, M.D.   03/16/2012  *RADIOLOGY REPORT*  Clinical Data: Postop 1 week for cervical fusion.  Left-sided swelling concerning for hematoma versus infection.  MR NECK SOFT TISSUE ONLY WITHOUT AND WITH CONTRAST  Contrast: 20mL MULTIHANCE GADOBENATE DIMEGLUMINE 529 MG/ML IV SOLN  Comparison: Postoperative plain film examination of the cervical spine 03/10/2012.  Findings: Motion degraded exam.  This limits evaluation.  Fusion C3-C7.  The examination was not performed to evaluate the individual disc spaces.  No significant cervical spinal stenosis detected on this non dedicated motion degraded exam.  Complex fluid collection extends along the left neck lateral to the thyroid cartilage/ hyoid to the level of the fusion. Peripheral enhancement and is suspicious for infection.  Hematoma which has become infected or cerebrospinal fluid leak not excluded.  Fluid collection measures approximately 4.6 x 4.9 x 1.8 cm.  Local mass effect upon  the surrounding structures including narrowing and rightward deviation of the supraglottic and glottic region.  No clear extension of inflammatory process into the canal although evaluation is significantly limited.  If this is of concern, dedicated cervical spine MR with sedation may be considered.  IMPRESSION: Complex fluid collection extends along the left neck lateral to the thyroid cartilage/ hyoid to the level of the fusion. Peripheral enhancement and is suspicious for infection.  Hematoma which has become infected or cerebrospinal fluid leak not excluded.  Fluid collection measures approximately 4.6 x 4.9 x 1.8 cm.  Local mass effect upon the surrounding structures including narrowing and rightward deviation of the supraglottic and glottic region.  This has been made a PRA call report utilizing dashboard call feature.   Original Report Authenticated By: Fuller Canada, M.D.    Dg C-arm 1-60 Min  03/12/2012  *RADIOLOGY REPORT*  Clinical Data: S R tatus post cervical spine fusion.  DG C-ARM 1-60 MIN,CERVICAL SPINE - 2-3 VIEW  Comparison: 03/03/2012.  Findings: Postoperative changes of a ACDF from C3-C5 are noted. Anterior plate and screw fixation device is in place at these levels, and there are interbody cages at the C3-C4 and C4-C5 interspaces.  Alignment is anatomic.  Prevertebral soft tissues appear mildly swollen, which is within normal limits in the immediate postoperative state.  IMPRESSION: 1.  Postoperative changes of a ACDF at C3-C5, as above.  Original Report Authenticated By: Florencia Reasons, M.D.    Discharge Orders    Future Orders Please Complete By Expires   Diet - low sodium heart healthy      Call MD / Call 911      Comments:   If you experience chest pain or shortness of breath, CALL 911 and be transported to the hospital emergency room.  If you develope a fever above 101 F, pus (white drainage) or increased drainage or redness at the wound, or calf pain, call your surgeon's  office.   Constipation Prevention      Comments:   Drink plenty of fluids.  Prune juice may be helpful.  You may use a stool softener, such as Colace (over the counter) 100 mg twice a day.  Use MiraLax (over the counter) for constipation as needed.   Increase activity slowly as tolerated      Discharge instructions      Comments:  Keep incision clean and dry.  Leave steri strips in place.  May shower 5 days from surgery; pat to dry following shower.  May redress with clean, dry dressing if you would like.  Do not apply any lotion/cream/ointment to the incision.  May remove cervical collar for eating, sleeping (while lying in bed) and showering.   Driving restrictions      Comments:   No driving for 2 weeks.  Dr Shon Baton will discuss addition driving restrictions at your first post-op visit in 2 weeks.   Lifting restrictions      Comments:   No lifting anything greater than 5 pounds.  DO NOT reach overhead (above shoulder height).  NO bending, stooping or squatting.  Dr. Shon Baton will discuss additional lifting restrictions at your first post-op visit in 2 weeks.      Follow-up Information    Follow up with Alvy Beal, MD in 2 weeks.   Contact information:   [facility] [address], [address] [place] [address] [phone]          Discharge Plan:  discharge to Home   Disposition: stable at the time of discharge     Signed: Gwinda Maine for Dr. Venita Lick [facility] [phone] 03/19/2012, 5:36 AM

## 2012-03-19 NOTE — Progress Notes (Signed)
    Subjective: Procedure(s) (LRB): EVACUATION HEMATOMA (N/A) 3 Days Post-Op  Patient reports pain as 3 on 0-10 scale.  Reports none arm pain reports incisional neck pain   Positive void Positive bowel movement Positive flatus Negative chest pain or shortness of breath  Objective: Vital signs in last 24 hours: Temp:  [98.3 F (36.8 C)] 98.3 F (36.8 C) (08/22 2200) Pulse Rate:  [88-98] 88  (08/22 2200) Resp:  [20] 20  (08/22 2200) BP: (105-120)/(74-75) 120/75 mmHg (08/22 2200) SpO2:  [98 %] 98 % (08/22 2200)  Intake/Output from previous day: 08/22 0701 - 08/23 0700 In: 1200 [P.O.:960; I.V.:240] Out: -   Labs: No results found for this basename: WBC:2,RBC:2,HCT:2,PLT:2 in the last 72 hours No results found for this basename: NA:2,K:2,CL:2,CO2:2,BUN:2,CREATININE:2,GLUCOSE:2,CALCIUM:2 in the last 72 hours No results found for this basename: LABPT:2,INR:2 in the last 72 hours  Physical Exam: Neurologically intact Neurovascular intact Incision: dressing C/D/I  Assessment/Plan: Patient stable  Pain controlled with current regimen of medication Continue care at present  DC later this morning; medications done Follow up with Dr. Shon Baton 2 weeks   Gwinda Maine for Dr. Venita Lick [facility] [phone] 03/19/2012, 5:54 AM

## 2012-03-20 LAB — BODY FLUID CULTURE: Culture: NO GROWTH

## 2012-03-21 LAB — ANAEROBIC CULTURE

## 2012-03-25 ENCOUNTER — Emergency Department (HOSPITAL_COMMUNITY): Payer: BC Managed Care – PPO

## 2012-03-25 ENCOUNTER — Encounter (HOSPITAL_COMMUNITY): Payer: Self-pay | Admitting: *Deleted

## 2012-03-25 ENCOUNTER — Emergency Department (HOSPITAL_COMMUNITY)
Admission: EM | Admit: 2012-03-25 | Discharge: 2012-03-25 | Disposition: A | Discharge status: Home / Self Care | Payer: BC Managed Care – PPO | Attending: Emergency Medicine | Admitting: Emergency Medicine

## 2012-03-25 DIAGNOSIS — R079 Chest pain, unspecified: Secondary | ICD-10-CM | POA: Insufficient documentation

## 2012-03-25 DIAGNOSIS — R0609 Other forms of dyspnea: Secondary | ICD-10-CM | POA: Insufficient documentation

## 2012-03-25 DIAGNOSIS — M542 Cervicalgia: Secondary | ICD-10-CM | POA: Insufficient documentation

## 2012-03-25 DIAGNOSIS — R0600 Dyspnea, unspecified: Secondary | ICD-10-CM

## 2012-03-25 DIAGNOSIS — Z87891 Personal history of nicotine dependence: Secondary | ICD-10-CM | POA: Insufficient documentation

## 2012-03-25 DIAGNOSIS — R0989 Other specified symptoms and signs involving the circulatory and respiratory systems: Secondary | ICD-10-CM | POA: Insufficient documentation

## 2012-03-25 DIAGNOSIS — Z79899 Other long term (current) drug therapy: Secondary | ICD-10-CM | POA: Insufficient documentation

## 2012-03-25 DIAGNOSIS — I10 Essential (primary) hypertension: Secondary | ICD-10-CM | POA: Insufficient documentation

## 2012-03-25 DIAGNOSIS — J45909 Unspecified asthma, uncomplicated: Secondary | ICD-10-CM | POA: Insufficient documentation

## 2012-03-25 DIAGNOSIS — Z9089 Acquired absence of other organs: Secondary | ICD-10-CM | POA: Insufficient documentation

## 2012-03-25 MED ORDER — IOHEXOL 300 MG/ML  SOLN
75.0000 mL | Freq: Once | INTRAMUSCULAR | Status: AC | PRN
  Administered 2012-03-25: 75 mL via INTRAVENOUS

## 2012-03-25 NOTE — ED Notes (Signed)
Pt states he had neck surgery and then developed a blood clot neck. Reports he has had hiccups since he was discharged from hospital about a week ago. Pt arrived to triage hyperventilating. O2 sats 100% on RA. Calming techniques used. Reports continued to have neck. Pt reports for the last 2 days he has been sob because of hiccups.

## 2012-03-25 NOTE — ED Notes (Signed)
Complains of hiccups continuously since last Friday at discharge from hospital.

## 2012-03-25 NOTE — ED Notes (Signed)
Spoke with Dr Shon Baton, would like pt to be evaluated by EDP and if consult needs to happen would like EDP to call whoever is on call for his group.

## 2012-03-25 NOTE — ED Notes (Signed)
Patient returned from CT

## 2012-03-25 NOTE — ED Provider Notes (Signed)
History     CSN: [id number]  Arrival date & time 03/25/12  1646   First MD Initiated Contact with Patient 03/25/12 1857      Chief Complaint  Patient presents with  . Shortness of Breath    (Consider location/radiation/quality/duration/timing/severity/associated sxs/prior treatment) HPI Comments: Patient presents today with a chief complaint of dyspnea.  He states that he feels that there is a blockage in his neck that is making it difficult for air to go through his trachea.  Symptoms have been present for the past 2 days and are gradually worsening.  He is also complaining of a cough.  He states that he had a fever of 100.4 F orally two days ago, but no fever since that time.  He had Multilevel ACDF for cervical spondylotic disease done by Dr. Shon Baton with Orthopedics on 03/10/12.  He then returned on 03/16/12 due to swelling around the incision site.  At that time he was found to have a hematoma and evacuation of the hematoma was performed surgically on 03/16/12.  He states that he had been doing well up until the past two days.  Patient is a 46 y.o. male presenting with shortness of breath. The history is provided by the patient and medical records.  Shortness of Breath  Associated symptoms include a fever, cough and shortness of breath.    Past Medical History  Diagnosis Date  . Hypertension     takes med  . Anxiety     impending surgery  . Bronchitis, allergic     usese inhaler at times Ventolin  . Asthma   . Shortness of breath 03/16/2012    "right now; cause of neck pain"    Past Surgical History  Procedure Date  . Appendectomy   . Anterior cervical decomp/discectomy fusion 03/10/2012    Procedure: ANTERIOR CERVICAL DECOMPRESSION/DISCECTOMY FUSION 2 LEVELS;  Surgeon: Venita Lick, MD;  Location: [facility] OR;  Service: Orthopedics;  Laterality: N/A;  ACDF C3-5  . Total knee arthroplasty     "left; for torn ACL"  . Joint replacement   . Inguinal hernia repair     hernia repair  with mesh  . Hematoma evacuation 03/16/2012    Procedure: EVACUATION HEMATOMA;  Surgeon: Venita Lick, MD;  Location: [facility] OR;  Service: Orthopedics;  Laterality: N/A;  evacuation hematoma    History reviewed. No pertinent family history.  History  Substance Use Topics  . Smoking status: Former Smoker -- 2.0 packs/day for 31 years    Types: Cigarettes    Quit date: 02/24/2012  . Smokeless tobacco: Never Used  . Alcohol Use: 7.2 oz/week    12 Cans of beer per week      Review of Systems  Constitutional: Positive for fever. Negative for chills.  HENT: Positive for neck pain.   Respiratory: Positive for cough and shortness of breath.   Gastrointestinal: Negative for nausea and vomiting.  Skin: Negative for color change.  Neurological: Negative for numbness and headaches.    Allergies  Vicodin  Home Medications   Current Outpatient Rx  Name Route Sig Dispense Refill  . ALBUTEROL SULFATE HFA 108 (90 BASE) MCG/ACT IN AERS Inhalation Inhale 2 puffs into the lungs every 4 (four) hours as needed. For shortness of breath    . CETIRIZINE HCL 10 MG PO TABS Oral Take 10 mg by mouth daily.    Marland Kitchen LISINOPRIL 40 MG PO TABS Oral Take 40 mg by mouth daily.    Marland Kitchen METHOCARBAMOL 500 MG PO  TABS Oral Take 1 tablet (500 mg total) by mouth 3 (three) times daily. MAX 3 pills daily 42 tablet 0  . ONDANSETRON HCL 4 MG PO TABS Oral Take 4 mg by mouth every 8 (eight) hours as needed. For nausea    . OXYCODONE HCL 20 MG PO TABS Oral Take 20 mg by mouth every 4 (four) hours as needed. For pain    . PRESCRIPTION MEDICATION Topical Apply 1 application topically 2 (two) times daily. Antibiotic cream for sores on arm      BP 92/59  Pulse 100  Temp 98 F (36.7 C) (Oral)  Resp 26  SpO2 100%  Physical Exam  Nursing note and vitals reviewed. Constitutional: He appears well-developed and well-nourished.  HENT:  Head: Normocephalic and atraumatic.  Mouth/Throat: Oropharynx is clear and moist.  Neck: Normal  range of motion. Neck supple. No edema and no erythema present.    Cardiovascular: Normal rate, regular rhythm and normal heart sounds.   Pulmonary/Chest: Effort normal and breath sounds normal. No stridor. No respiratory distress. He has no wheezes. He has no rales.  Neurological: He is alert.  Skin: Skin is warm and dry. No erythema.  Psychiatric: He has a normal mood and affect.    ED Course  Procedures (including critical care time)  Labs Reviewed - No data to display Dg Chest 2 View  03/25/2012  *RADIOLOGY REPORT*  Clinical Data: Chest pain, shortness of breath  CHEST - 2 VIEW  Comparison: 03/03/2012  Findings: Hypoaeration results in interstitial and vascular crowding. Mild peribronchial thickening.  Mild lung base opacities. Cardiomediastinal contours within normal limits.  No pleural effusion or pneumothorax.  No acute osseous finding.  High attenuation within the colon may reflect retained contrast from a prior outside examination.  IMPRESSION: Mild bibasilar opacities, likely atelectasis.  Mild peribronchial thickening as can be chronic or seen with reactive airway disease or bronchitis.   Original Report Authenticated By: Waneta Martins, M.D.    Ct Soft Tissue Neck W Contrast  03/25/2012  *RADIOLOGY REPORT*  Clinical Data: History of cervical fusion 03/10/2012.  Pain and swelling.  Neck hematoma versus abscess by prior MRI.  CT NECK WITH CONTRAST  Technique:  Multidetector CT imaging of the neck was performed with intravenous contrast.  Contrast: 75mL OMNIPAQUE IOHEXOL 300 MG/ML  SOLN  Comparison: MRI of the neck 03/16/2012.  Findings: The previously seen fluid collection extending around the left aspect of the thyroid cartilage at approximately the C5-6 level appears much smaller.  There appears to be some residual fluid at the level of C4 immediately anterior to the fusion hardware measuring 1.2 cm transverse by 1 cm AP by 2.3 cm cranial- caudal.  No new fluid collection is  identified.  There is no lymphadenopathy.  Major salivary glands appear normal. Intracranial contents are unremarkable.  Postoperative change of C3- 5 fusion again seen.  Lung apices are clear.  IMPRESSION: Previously identified fluid collection in the anterior and left aspect of the neck centered about the thyroid cartilage is smaller. No new abnormality is identified.   Original Report Authenticated By: Bernadene Bell. Maricela Curet, M.D.      No diagnosis found.  Discussed patient with Dr Fonnie Jarvis.  He recommends getting a CT soft tissue neck.    9:57 PM Discussed results of the CT and patients low grade fever with Dr. Fonnie Jarvis.  Patient is stable for discharge and can follow up with ENT in the morning.   MDM  Patient had previous surgery  of his cervical spine on 03/10/12.  He then again had surgery on 03/16/12 for evacuation of a hematoma at the incision site.  He comes in today with increased pain of his neck and feeling as if he can not breathe due to air becoming blocked in his neck.  Oxygen sat 100 on RA.  CT showing previously identified fluid collection in the neck is smaller.  Negative CXR.  Patient does not not appear to be in any respiratory distress. Patient has an appointment scheduled with Dr. Shon Baton tomorrow morning.          Pascal Lux Knowles, PA-C 03/26/12 1239

## 2012-03-25 NOTE — ED Notes (Signed)
Strep Screen resulted on wrong pt, spoke with lab and will send credit form to have charge removed from pt. This test was not completed on the patient.

## 2012-03-27 NOTE — ED Provider Notes (Signed)
Medical screening examination/treatment/procedure(s) were performed by non-physician practitioner and as supervising physician I was immediately available for consultation/collaboration.  Hurman Horn, MD 03/27/12 Rickey Primus

## 2014-02-07 IMAGING — RF DG C-ARM 61-120 MIN
1 series · 2 of 2 positions shown · non-contrast
Comparison: 03/03/2012.

CLINICAL DATA: S R tatus post cervical spine fusion.

DG C-ARM 1-60 MIN,CERVICAL SPINE - 2-3 VIEW

[Series 1: run · 2 of 2 slices shown]
[im 1/2]
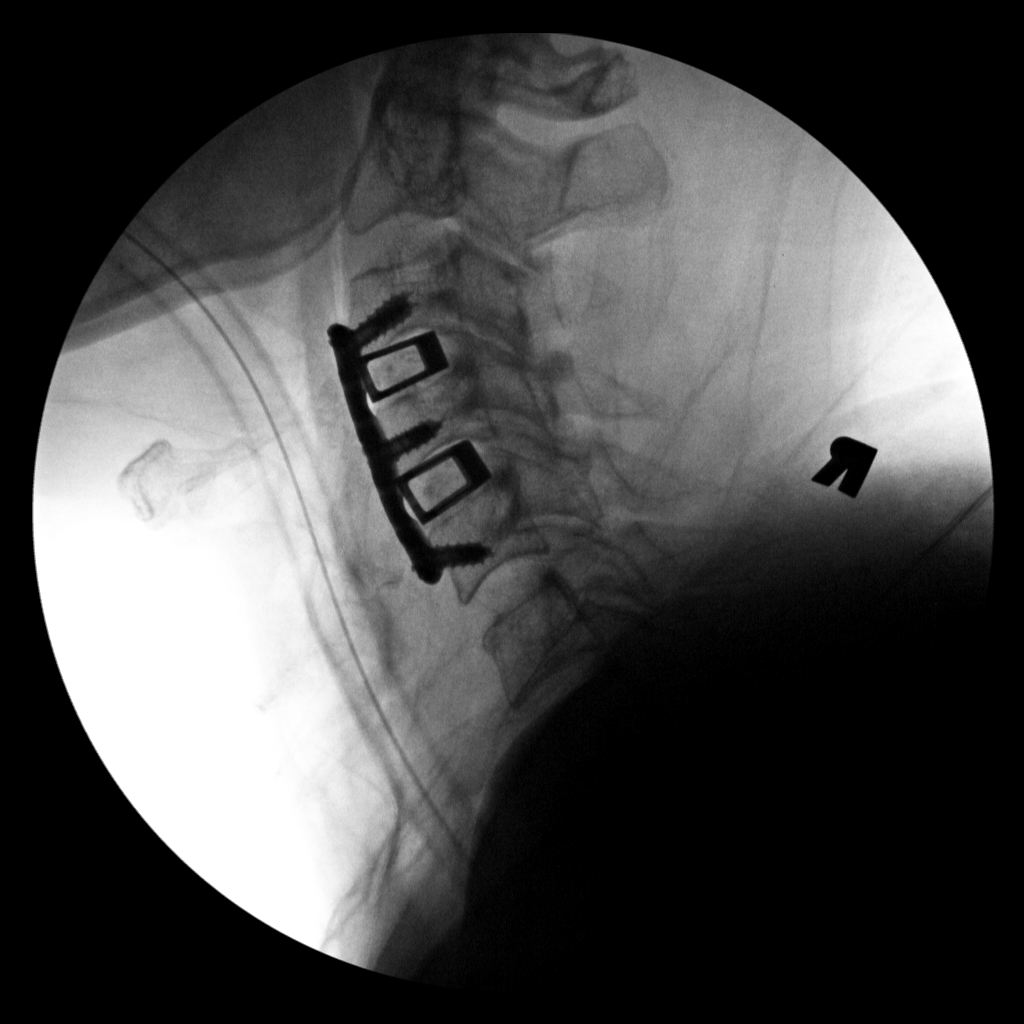
[im 2/2]
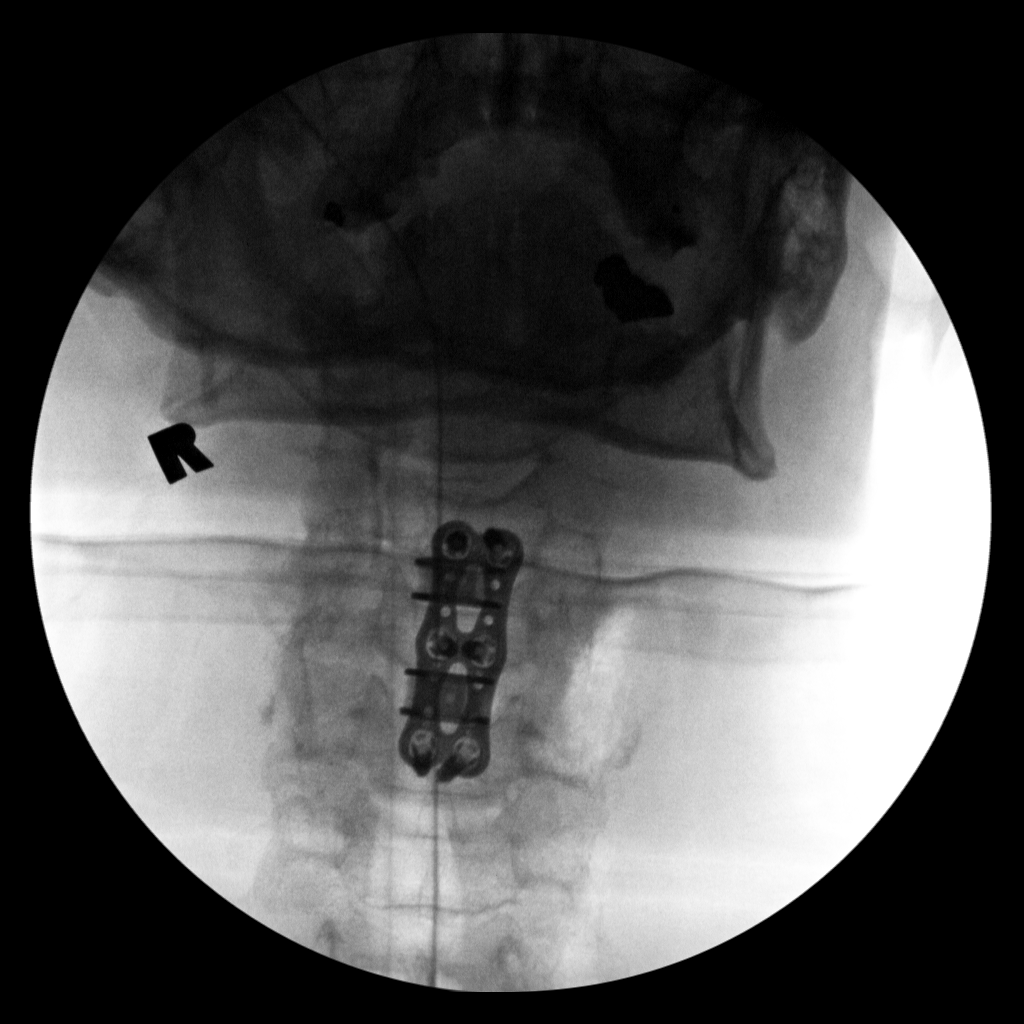

[2 of 2 positions shown; findings below may reference images not displayed]

FINDINGS: Postoperative changes of a ACDF from C3-C5 are noted.
Anterior plate and screw fixation device is in place at these
levels, and there are interbody cages at the C3-C4 and C4-C5
interspaces.  Alignment is anatomic.  Prevertebral soft tissues
appear mildly swollen, which is within normal limits in the
immediate postoperative state.
IMPRESSION: 1.  Postoperative changes of a ACDF at C3-C5, as above.

## 2019-01-03 ENCOUNTER — Ambulatory Visit: Payer: Self-pay | Admitting: Orthopedic Surgery

## 2019-01-07 ENCOUNTER — Ambulatory Visit: Payer: Self-pay | Admitting: Orthopedic Surgery

## 2019-01-07 NOTE — H&P (Deleted)
  The note originally documented on this encounter has been moved the the encounter in which it belongs.  

## 2019-01-07 NOTE — Pre-Procedure Instructions (Signed)
[place], [place] - [address] [place] [address] Phone: [phone] Fax: [phone]  [id number] - [place], [place] - [address] [place] [address] Phone: [phone] Fax: [place], [place] - [address] [place] [address] Phone: [phone] Fax: [phone]      Your procedure is scheduled on Thursday, June 18th.  Report to [facility] Main Entrance "A" at 9:00 A.M., and check in at the Admitting office.  Call this number if you have problems the morning of surgery:  [phone]  Call [phone] if you have any questions prior to your surgery date Monday-Friday 8am-4pm    Remember:  Do not eat or drink after midnight.    Take these medicines the morning of surgery with A SIP OF WATER  ondansetron (ZOFRAN)-if needed for nausea Albuterol inhaler-if needed for shortness of breath. Please bring with you to the hospital.  As of today, STOP taking any Aspirin (unless otherwise instructed by your surgeon), Aleve, Naproxen, Ibuprofen, Motrin, Advil, Goody's, BC's, all herbal medications, fish oil, and all vitamins.    The Morning of Surgery  Do not wear jewelry.  Do not wear lotions, powders, or colognes, or deodorant  Do not shave 48 hours prior to surgery.  Men may shave face and neck.  Do not bring valuables to the hospital.  [facility] is not responsible for any belongings or valuables.  If you are a smoker, DO NOT Smoke 24 hours prior to surgery IF you wear a CPAP at night please bring your mask, tubing, and machine the morning of surgery   Remember that you must have someone to transport you home after your surgery, and remain with you for 24 hours if you are discharged the same day.   Contacts, glasses, hearing aids, dentures or bridgework may not be worn into surgery.    Leave your suitcase in  the car.  After surgery it may be brought to your room.  For patients admitted to the hospital, discharge time will be determined by your treatment team.  Patients discharged the day of surgery will not be allowed to drive home.    Special instructions:   [facility]- Preparing For Surgery  Before surgery, you can play an important role. Because skin is not sterile, your skin needs to be as free of germs as possible. You can reduce the number of germs on your skin by washing with CHG (chlorahexidine gluconate) Soap before surgery.  CHG is an antiseptic cleaner which kills germs and bonds with the skin to continue killing germs even after washing.    Oral Hygiene is also important to reduce your risk of infection.  Remember - BRUSH YOUR TEETH THE MORNING OF SURGERY WITH YOUR REGULAR TOOTHPASTE  Please do not use if you have an allergy to CHG or antibacterial soaps. If your skin becomes reddened/irritated stop using the CHG.  Do not shave (including legs and underarms) for at least 48 hours prior to first CHG shower. It is OK to shave your face.  Please follow these instructions carefully.   1. Shower the NIGHT BEFORE SURGERY and the MORNING OF SURGERY with CHG Soap.   2. If you chose to wash your hair, wash your hair first as usual with your normal shampoo.  3. After you shampoo, rinse your hair and body thoroughly to remove the shampoo.  4. Use CHG as you would any other liquid soap. You can apply CHG directly to the skin and wash gently with a scrungie or a clean washcloth.   5. Apply the CHG Soap to your body ONLY FROM THE NECK DOWN.  Do not use on open wounds or open sores. Avoid contact with your eyes, ears, mouth and genitals (private parts). Wash Face and genitals (private parts)  with your normal soap.   6. Wash thoroughly, paying special attention to the area where your surgery will be performed.  7. Thoroughly rinse your body with warm water from the neck down.  8. DO NOT  shower/wash with your normal soap after using and rinsing off the CHG Soap.  9. Pat yourself dry with a CLEAN TOWEL.  10. Wear CLEAN PAJAMAS to bed the night before surgery, wear comfortable clothes the morning of surgery  11. Place CLEAN SHEETS on your bed the night of your first shower and DO NOT SLEEP WITH PETS.    Day of Surgery:  Do not apply any deodorants/lotions.  Please wear clean clothes to the hospital/surgery center.   Remember to brush your teeth WITH YOUR REGULAR TOOTHPASTE.   Please read over the following fact sheets that you were given.

## 2019-01-07 NOTE — H&P (Signed)
Subjective:   [name] is a plesant 53 yo male with PMH significant for C3-5 ACDF (left approach) complicated by post-operative hematoma and he is here today for a history and physical. He is scheduled for an ACDF C5-7 by Dr. Shon BatonBrooks at [facility] on January 13, 2019. He complains of significant neck pain radiating into the left C7 dermatome ongoing since July 2019, he has failed to have significant long term relief from medications and injections. He therefore, has elected to move forward with surgical intervention.  Of note patient was recently prescribed a medrol dose pack for bronchitis. This is his last day. We have obtained clearance from his PCP. We have also had him evaluated for a Right sided anterior approach by an ENT. Patient was provided a ASPEN collar at today's visit.  Past Medical History:  Diagnosis Date  . Anxiety    impending surgery  . Asthma   . Bronchitis, allergic    usese inhaler at times Ventolin  . Hypertension    takes med  . Shortness of breath 03/16/2012   "right now; cause of neck pain"    Past Surgical History:  Procedure Laterality Date  . ANTERIOR CERVICAL DECOMP/DISCECTOMY FUSION  03/10/2012   Procedure: ANTERIOR CERVICAL DECOMPRESSION/DISCECTOMY FUSION 2 LEVELS;  Surgeon: Venita Lickahari Brooks, MD;  Location: [facility] OR;  Service: Orthopedics;  Laterality: N/A;  ACDF C3-5  . APPENDECTOMY    . HEMATOMA EVACUATION  03/16/2012   Procedure: EVACUATION HEMATOMA;  Surgeon: Venita Lickahari Brooks, MD;  Location: [facility] OR;  Service: Orthopedics;  Laterality: N/A;  evacuation hematoma  . INGUINAL HERNIA REPAIR     hernia repair with mesh  . JOINT REPLACEMENT    . TOTAL KNEE ARTHROPLASTY     "left; for torn ACL"    Current Outpatient Medications  Medication Sig Dispense Refill Last Dose  . albuterol (PROVENTIL HFA;VENTOLIN HFA) 108 (90 BASE) MCG/ACT inhaler Inhale 2 puffs into the lungs every 4 (four) hours as needed for wheezing or shortness of breath.    03/25/2012 at Unknown   . lisinopril (PRINIVIL,ZESTRIL) 40 MG tablet Take 40 mg by mouth daily.   03/25/2012 at Unknown  . LORazepam (ATIVAN) 0.5 MG tablet Take 0.5 mg by mouth at bedtime as needed for sleep.     Marland Kitchen. ondansetron (ZOFRAN) 4 MG tablet Take 4 mg by mouth every 8 (eight) hours as needed for nausea or vomiting.    Past Week at Unknown  . testosterone cypionate (DEPOTESTOTERONE CYPIONATE) 100 MG/ML injection Inject 100 mg into the muscle every 14 (fourteen) days.      No current facility-administered medications for this visit.    Allergies  Allergen Reactions  . Vicodin [Hydrocodone-Acetaminophen] Itching    Social History   Tobacco Use  . Smoking status: Former Smoker    Packs/day: 2.00    Years: 31.00    Pack years: 62.00    Types: Cigarettes    Quit date: 02/24/2012    Years since quitting: 6.8  . Smokeless tobacco: Never Used  Substance Use Topics  . Alcohol use: Yes    Alcohol/week: 12.0 standard drinks    Types: 12 Cans of beer per week    No family history on file.  Review of Systems As stated in HPI  Objective:   Clinical exam: Patient is alert and oriented 3. No shortness of breath or chest pain.  Cardiovascular: RRR, No rubs, murmers, or gallops.  Lungs: + crackles in right upper lobe (on dose pack for bronchitis), +  cough; All other lobes CTA.  Abdomen: BSx4. Soft and nontender, non-distended.. No loss of bowel and bladder control. No rebound tenderness. No hepatosplenomegaly.  Neck: Significant neck pain radiating into the left C7 dermatome. No significant shoulder, elbow, wrist pain with isolated joint range of motion. Negative Tinel sign at the elbow and wrist. Neuro: 5/5 motor strength in the upper extremity bilaterally. Positive decreased sensation to light touch in the left C7 dermatome. No C8 radiculopathy or sensory changes. Positive Spurling sign with reproduction of left C7 radicular pain. 1+ symmetrical deep tendon reflexes bilaterally in the upper  extremity.  Reflexes: Hoffman: Negative Babinski: Negative  Gait: Normal, no assistive devices.  Patient reports 2 weeks of significant improvement in his pain in quality-of-life following the recent C7 selective nerve root block.  Cervical MRI: completed on 10/11/18 was reviewed with the patient. I have also reviewed the radiology report. C6-7: Severe left foraminal stenosis which is progressed since his prior MRI of 03/24/17. Severe left foraminal stenosis C7-T1 also progressed since prior MRI. No cord signal changes. No canal or foraminal stenosis at the operative 3 4 and 4 5 level.  Assessment:   [name] is a plesant 53 yo male with PMH significant for C3-5 ACDF (left approach) complicated by post-operative hematoma who has neck pain and radicular arm pain caused by foraminal stenosis at the C7 level, this pain has failed to improve significantly with conservative measures. Fortunately he has no motor deficits nor does he have any signs of myelopathy.  Plan:     We will move forward with surgical intervention On 01/13/2019 at [facility] with Dr. Rolena Infante. Due to his previous C3-5 fusion, in order to address the C7 level (via C6/7 fusion) we must also fuse the C6/5 level. Due to his previous surgery being performed on the left side, and desire for decreased scar tissue, We will take a right-sided anterior approach.  Risks and benefits of surgery were discussed with the patient. These include: Infection, bleeding, death, stroke, paralysis, ongoing or worse pain, need for additional surgery, nonunion, leak of spinal fluid, adjacent segment degeneration requiring additional fusion surgery. Pseudoarthrosis (nonunion)requiring supplemental posterior fixation. Throat pain, swallowing difficulties, hoarseness or change in voice.  With respect to disc replacement: Additional risks include heterotopic ossification, inability to place the disc due to technical issues requiring bailout to a  fusion procedure.  We have also discussed the goals of surgery to include: Goals of surgery: Reduction in pain, and improvement in quality of life.  We have also discussed the post-operative recovery period to include: bathing/showering restrictions, wound healing, activity (and driving) restrictions, medications/pain mangement.  We have also discussed post-operative redflags to include: signs and symptoms of postoperative infection, DVT/PE.  All of patient's questions were invited and answered.  Follow-up: Patient will follow-up in office 2 weeks status post surgery

## 2019-01-10 ENCOUNTER — Other Ambulatory Visit (HOSPITAL_COMMUNITY)
Admission: RE | Admit: 2019-01-10 | Discharge: 2019-01-10 | Disposition: A | Discharge status: Home / Self Care | Payer: 59 | Source: Ambulatory Visit | Attending: Orthopedic Surgery | Admitting: Orthopedic Surgery

## 2019-01-10 ENCOUNTER — Other Ambulatory Visit: Payer: Self-pay

## 2019-01-10 ENCOUNTER — Encounter (HOSPITAL_COMMUNITY): Payer: Self-pay

## 2019-01-10 ENCOUNTER — Ambulatory Visit (HOSPITAL_COMMUNITY)
Admission: RE | Admit: 2019-01-10 | Discharge: 2019-01-10 | Disposition: A | Discharge status: Home / Self Care | Payer: 59 | Source: Ambulatory Visit | Attending: Orthopedic Surgery | Admitting: Orthopedic Surgery

## 2019-01-10 ENCOUNTER — Encounter (HOSPITAL_COMMUNITY)
Admission: RE | Admit: 2019-01-10 | Discharge: 2019-01-10 | Disposition: A | Discharge status: Home / Self Care | Payer: 59 | Source: Ambulatory Visit | Attending: Orthopedic Surgery | Admitting: Orthopedic Surgery

## 2019-01-10 DIAGNOSIS — Z01818 Encounter for other preprocedural examination: Secondary | ICD-10-CM

## 2019-01-10 DIAGNOSIS — I10 Essential (primary) hypertension: Secondary | ICD-10-CM | POA: Insufficient documentation

## 2019-01-10 DIAGNOSIS — Z1159 Encounter for screening for other viral diseases: Secondary | ICD-10-CM | POA: Diagnosis not present

## 2019-01-10 HISTORY — DX: Other spondylosis with radiculopathy, cervical region: M47.22

## 2019-01-10 HISTORY — DX: Headache, unspecified: R51.9

## 2019-01-10 HISTORY — DX: Unspecified osteoarthritis, unspecified site: M19.90

## 2019-01-10 LAB — PROTIME-INR
INR: 1 (ref 0.8–1.2)
Prothrombin Time: 13.2 seconds (ref 11.4–15.2)

## 2019-01-10 LAB — SURGICAL PCR SCREEN
MRSA, PCR: NEGATIVE
Staphylococcus aureus: NEGATIVE

## 2019-01-10 LAB — URINALYSIS, ROUTINE W REFLEX MICROSCOPIC
Bilirubin Urine: NEGATIVE
Glucose, UA: NEGATIVE mg/dL
Hgb urine dipstick: NEGATIVE
Ketones, ur: NEGATIVE mg/dL
Leukocytes,Ua: NEGATIVE
Nitrite: NEGATIVE
Protein, ur: NEGATIVE mg/dL
Specific Gravity, Urine: 1.017 (ref 1.005–1.030)
pH: 6 (ref 5.0–8.0)

## 2019-01-10 LAB — APTT: aPTT: 32 seconds (ref 24–36)

## 2019-01-10 NOTE — Progress Notes (Signed)
Pt denies SOB, chest pain, and being under the care of a cardiologist. Pt denies having a stress test, echo and cardiac cath. Pt denies having an EKG and chest x ray within the last year. Labs on chart from PCP, Dr. Owens Shark of [facility] ( CBC with Diff, [facility] 01/03/2019).   Pt denies that he and family members tested positive for COVID-19 ( pt scheduled to be tested after pre-op appointment and reminded to quarantine).  Pt denies that he and family members  experienced the following symptoms:  Cough yes/no: No Fever (>100.1F)  yes/no: No Runny nose yes/no: No Sore throat yes/no: No Difficulty breathing/shortness of breath  yes/no: No  Have you or a family member traveled in the last 14 days and where? yes/no: No  Pt reminded that hospital visitation restrictions are in effect and the importance of the restrictions.   Pt verbalized understanding of all pre-op instructions.  Pt chart forwarded to PA, Anesthesiology, per [place].

## 2019-01-10 NOTE — Pre-Procedure Instructions (Signed)
Your procedure is scheduled on Thursday, January 13, 2019  Report to [facility] Main Entrance "A" at 7:30 A.M., and check in at the Admitting office.  Call this number if you have problems the morning of surgery:  [phone]  Call [phone] if you have any questions prior to your surgery date Monday-Friday 8am-4pm    Remember:  Do not eat or drink after midnight Wednesday, January 12, 2019    Take these medicines the morning of surgery with A SIP OF WATER:  ondansetron (ZOFRAN)-if needed for nausea Albuterol inhaler-if needed for shortness of breath (Please bring inhaler in with you to the hospital)  As of today, STOP taking any Aspirin (unless otherwise instructed by your surgeon), Aleve, Naproxen, Ibuprofen, Motrin, Advil, Goody's, BC's, all herbal medications, fish oil, and all vitamins.    The Morning of Surgery  Do not wear jewelry.  Do not wear lotions, powders, or colognes, or deodorant  Do not shave 48 hours prior to surgery.  Men may shave face and neck.  Do not bring valuables to the hospital.  [facility] is not responsible for any belongings or valuables.  If you are a smoker, DO NOT Smoke 24 hours prior to surgery IF you wear a CPAP at night please bring your mask, tubing, and machine the morning of surgery   Remember that you must have someone to transport you home after your surgery, and remain with you for 24 hours if you are discharged the same day.   Contacts, glasses, hearing aids, dentures or bridgework may not be worn into surgery.    Leave your suitcase in the car.  After surgery it may be brought to your room.  For patients admitted to the hospital, discharge time will be determined by your treatment team.  Patients discharged the day of surgery will not be allowed to drive home.    Special instructions:   [facility]- Preparing For Surgery  Before surgery, you can play an important role. Because skin is not sterile, your skin needs to be as  free of germs as possible. You can reduce the number of germs on your skin by washing with CHG (chlorahexidine gluconate) Soap before surgery.  CHG is an antiseptic cleaner which kills germs and bonds with the skin to continue killing germs even after washing.    Oral Hygiene is also important to reduce your risk of infection.  Remember - BRUSH YOUR TEETH THE MORNING OF SURGERY WITH YOUR REGULAR TOOTHPASTE  Please do not use if you have an allergy to CHG or antibacterial soaps. If your skin becomes reddened/irritated stop using the CHG.  Do not shave (including legs and underarms) for at least 48 hours prior to first CHG shower. It is OK to shave your face.  Please follow these instructions carefully.   1. Shower the NIGHT BEFORE SURGERY and the MORNING OF SURGERY with CHG Soap.   2. If you chose to wash your hair, wash your hair first as usual with your normal shampoo.  3. After you shampoo, rinse your hair and body thoroughly to remove the shampoo.  4. Use CHG as you would any other liquid soap. You can apply CHG directly to the skin and wash gently with a scrungie or a clean washcloth.   5. Apply the CHG Soap to your body ONLY FROM THE NECK DOWN.  Do not use on open wounds or open sores. Avoid contact with your eyes, ears, mouth and genitals (  private parts). Wash Face and genitals (private parts)  with your normal soap.   6. Wash thoroughly, paying special attention to the area where your surgery will be performed.  7. Thoroughly rinse your body with warm water from the neck down.  8. DO NOT shower/wash with your normal soap after using and rinsing off the CHG Soap.  9. Pat yourself dry with a CLEAN TOWEL.  10. Wear CLEAN PAJAMAS to bed the night before surgery, wear comfortable clothes the morning of surgery  11. Place CLEAN SHEETS on your bed the night of your first shower and DO NOT SLEEP WITH PETS.    Day of Surgery:  Do not apply any deodorants/lotions.  Please wear clean  clothes to the hospital/surgery center.   Remember to brush your teeth WITH YOUR REGULAR TOOTHPASTE.   Please read over the following fact sheets that you were given.

## 2019-01-10 NOTE — Progress Notes (Signed)
Nurse spoke with Sherri, Surgical Coordinator, to have PA, clarify order for consent "  Cervical spondylosis with radiculopathy (left). "

## 2019-01-11 ENCOUNTER — Ambulatory Visit: Payer: Self-pay | Admitting: Orthopedic Surgery

## 2019-01-11 LAB — NOVEL CORONAVIRUS, NAA (HOSP ORDER, SEND-OUT TO REF LAB; TAT 18-24 HRS): SARS-CoV-2, NAA: NOT DETECTED

## 2019-01-11 NOTE — Anesthesia Preprocedure Evaluation (Addendum)
Anesthesia Evaluation  Patient identified by MRN, date of birth, ID band Patient awake    Reviewed: Allergy & Precautions, NPO status , Patient's Chart, lab work & pertinent test results  Airway Mallampati: II  TM Distance: >3 FB     Dental   Pulmonary former smoker,    breath sounds clear to auscultation       Cardiovascular hypertension,  Rhythm:Regular Rate:Normal     Neuro/Psych    GI/Hepatic negative GI ROS, Neg liver ROS,   Endo/Other  negative endocrine ROS  Renal/GU negative Renal ROS     Musculoskeletal   Abdominal   Peds  Hematology   Anesthesia Other Findings   Reproductive/Obstetrics                            Anesthesia Physical Anesthesia Plan  ASA: III  Anesthesia Plan: General   Post-op Pain Management:    Induction: Intravenous  PONV Risk Score and Plan: 2 and Ondansetron, Dexamethasone, Midazolam and Treatment may vary due to age or medical condition  Airway Management Planned: Oral ETT  Additional Equipment:   Intra-op Plan:   Post-operative Plan: Possible Post-op intubation/ventilation  Informed Consent: I have reviewed the patients History and Physical, chart, labs and discussed the procedure including the risks, benefits and alternatives for the proposed anesthesia with the patient or authorized representative who has indicated his/her understanding and acceptance.     Dental advisory given  Plan Discussed with: CRNA and Anesthesiologist  Anesthesia Plan Comments: (CBC and CMP completed by PCP 01/03/19 reviewed WNL (except triglycerides markedly elevated 701 mg/dl). Copy on pt chart.)      Anesthesia Quick Evaluation

## 2019-01-13 ENCOUNTER — Ambulatory Visit (HOSPITAL_COMMUNITY): Payer: 59

## 2019-01-13 ENCOUNTER — Encounter (HOSPITAL_COMMUNITY)
Admission: RE | Disposition: A | Discharge status: Home / Self Care | Payer: Self-pay | Source: Home / Self Care | Attending: Orthopedic Surgery

## 2019-01-13 ENCOUNTER — Ambulatory Visit (HOSPITAL_COMMUNITY): Payer: 59 | Admitting: Certified Registered Nurse Anesthetist

## 2019-01-13 ENCOUNTER — Other Ambulatory Visit: Payer: Self-pay

## 2019-01-13 ENCOUNTER — Encounter (HOSPITAL_COMMUNITY): Payer: Self-pay

## 2019-01-13 ENCOUNTER — Observation Stay (HOSPITAL_COMMUNITY)
Admission: RE | Admit: 2019-01-13 | Discharge: 2019-01-14 | Disposition: A | Discharge status: Home / Self Care | Payer: 59 | Attending: Orthopedic Surgery | Admitting: Orthopedic Surgery

## 2019-01-13 ENCOUNTER — Ambulatory Visit (HOSPITAL_COMMUNITY): Payer: 59 | Admitting: Physician Assistant

## 2019-01-13 DIAGNOSIS — Z96652 Presence of left artificial knee joint: Secondary | ICD-10-CM | POA: Insufficient documentation

## 2019-01-13 DIAGNOSIS — R070 Pain in throat: Secondary | ICD-10-CM | POA: Insufficient documentation

## 2019-01-13 DIAGNOSIS — Z7951 Long term (current) use of inhaled steroids: Secondary | ICD-10-CM | POA: Insufficient documentation

## 2019-01-13 DIAGNOSIS — Z981 Arthrodesis status: Secondary | ICD-10-CM | POA: Insufficient documentation

## 2019-01-13 DIAGNOSIS — M5020 Other cervical disc displacement, unspecified cervical region: Secondary | ICD-10-CM | POA: Diagnosis present

## 2019-01-13 DIAGNOSIS — Z79899 Other long term (current) drug therapy: Secondary | ICD-10-CM | POA: Insufficient documentation

## 2019-01-13 DIAGNOSIS — R499 Unspecified voice and resonance disorder: Secondary | ICD-10-CM | POA: Insufficient documentation

## 2019-01-13 DIAGNOSIS — J45909 Unspecified asthma, uncomplicated: Secondary | ICD-10-CM | POA: Insufficient documentation

## 2019-01-13 DIAGNOSIS — F419 Anxiety disorder, unspecified: Secondary | ICD-10-CM | POA: Insufficient documentation

## 2019-01-13 DIAGNOSIS — R490 Dysphonia: Secondary | ICD-10-CM | POA: Insufficient documentation

## 2019-01-13 DIAGNOSIS — M50122 Cervical disc disorder at C5-C6 level with radiculopathy: Secondary | ICD-10-CM | POA: Diagnosis not present

## 2019-01-13 DIAGNOSIS — Z885 Allergy status to narcotic agent status: Secondary | ICD-10-CM | POA: Diagnosis not present

## 2019-01-13 DIAGNOSIS — Z87891 Personal history of nicotine dependence: Secondary | ICD-10-CM | POA: Insufficient documentation

## 2019-01-13 DIAGNOSIS — M1990 Unspecified osteoarthritis, unspecified site: Secondary | ICD-10-CM | POA: Insufficient documentation

## 2019-01-13 DIAGNOSIS — M4722 Other spondylosis with radiculopathy, cervical region: Principal | ICD-10-CM | POA: Insufficient documentation

## 2019-01-13 DIAGNOSIS — I10 Essential (primary) hypertension: Secondary | ICD-10-CM | POA: Insufficient documentation

## 2019-01-13 DIAGNOSIS — Z419 Encounter for procedure for purposes other than remedying health state, unspecified: Secondary | ICD-10-CM

## 2019-01-13 HISTORY — PX: ANTERIOR CERVICAL DECOMP/DISCECTOMY FUSION: SHX1161

## 2019-01-13 SURGERY — ANTERIOR CERVICAL DECOMPRESSION/DISCECTOMY FUSION 2 LEVELS
Anesthesia: General | Site: Neck

## 2019-01-13 MED ORDER — ALBUTEROL SULFATE HFA 108 (90 BASE) MCG/ACT IN AERS: 2.0000 | INHALATION_SPRAY | RESPIRATORY_TRACT | Status: DC | PRN

## 2019-01-13 MED ORDER — ONDANSETRON HCL 4 MG PO TABS: 4.0000 mg | ORAL_TABLET | Freq: Three times a day (TID) | ORAL | 0 refills | Status: DC | PRN

## 2019-01-13 MED ORDER — ACETAMINOPHEN 325 MG PO TABS
650.0000 mg | ORAL_TABLET | ORAL | Status: DC | PRN
  Administered 2019-01-13 – 2019-01-14 (×2): 650 mg via ORAL
  Filled 2019-01-13 (×2): qty 2

## 2019-01-13 MED ORDER — THROMBIN 20000 UNITS EX SOLR
CUTANEOUS | Status: DC | PRN
  Administered 2019-01-13: 20 mL via TOPICAL

## 2019-01-13 MED ORDER — METHOCARBAMOL 500 MG PO TABS
ORAL_TABLET | ORAL | Status: AC
  Filled 2019-01-13: qty 1

## 2019-01-13 MED ORDER — SODIUM CHLORIDE 0.9 % IV SOLN: 250.0000 mL | INTRAVENOUS | Status: DC

## 2019-01-13 MED ORDER — LORAZEPAM 0.5 MG PO TABS: 0.5000 mg | ORAL_TABLET | Freq: Every evening | ORAL | Status: DC | PRN

## 2019-01-13 MED ORDER — FENTANYL CITRATE (PF) 250 MCG/5ML IJ SOLN
INTRAMUSCULAR | Status: AC
  Filled 2019-01-13: qty 5

## 2019-01-13 MED ORDER — DEXAMETHASONE SODIUM PHOSPHATE 10 MG/ML IJ SOLN
INTRAMUSCULAR | Status: DC | PRN
  Administered 2019-01-13: 10 mg via INTRAVENOUS

## 2019-01-13 MED ORDER — BUPIVACAINE-EPINEPHRINE 0.25% -1:200000 IJ SOLN
INTRAMUSCULAR | Status: DC | PRN
  Administered 2019-01-13: 5 mL

## 2019-01-13 MED ORDER — ONDANSETRON HCL 4 MG/2ML IJ SOLN
INTRAMUSCULAR | Status: AC
  Filled 2019-01-13: qty 2

## 2019-01-13 MED ORDER — ROCURONIUM BROMIDE 10 MG/ML (PF) SYRINGE
PREFILLED_SYRINGE | INTRAVENOUS | Status: AC
  Filled 2019-01-13: qty 10

## 2019-01-13 MED ORDER — SODIUM CHLORIDE 0.9 % IV SOLN
INTRAVENOUS | Status: DC | PRN
  Administered 2019-01-13: 10:00:00 25 ug/min via INTRAVENOUS

## 2019-01-13 MED ORDER — FENTANYL CITRATE (PF) 100 MCG/2ML IJ SOLN
INTRAMUSCULAR | Status: AC
  Filled 2019-01-13: qty 2

## 2019-01-13 MED ORDER — FENTANYL CITRATE (PF) 250 MCG/5ML IJ SOLN
INTRAMUSCULAR | Status: DC | PRN
  Administered 2019-01-13: 50 ug via INTRAVENOUS
  Administered 2019-01-13: 100 ug via INTRAVENOUS
  Administered 2019-01-13 (×4): 50 ug via INTRAVENOUS

## 2019-01-13 MED ORDER — EPHEDRINE 5 MG/ML INJ
INTRAVENOUS | Status: AC
  Filled 2019-01-13: qty 10

## 2019-01-13 MED ORDER — CEFAZOLIN SODIUM-DEXTROSE 1-4 GM/50ML-% IV SOLN
1.0000 g | Freq: Three times a day (TID) | INTRAVENOUS | Status: AC
  Administered 2019-01-13 – 2019-01-14 (×2): 1 g via INTRAVENOUS
  Filled 2019-01-13 (×2): qty 50

## 2019-01-13 MED ORDER — ACETAMINOPHEN 10 MG/ML IV SOLN
INTRAVENOUS | Status: DC | PRN
  Administered 2019-01-13: 1000 mg via INTRAVENOUS

## 2019-01-13 MED ORDER — CEFAZOLIN SODIUM-DEXTROSE 2-4 GM/100ML-% IV SOLN
2.0000 g | INTRAVENOUS | Status: AC
  Administered 2019-01-13: 2 g via INTRAVENOUS

## 2019-01-13 MED ORDER — ONDANSETRON HCL 4 MG/2ML IJ SOLN
INTRAMUSCULAR | Status: DC | PRN
  Administered 2019-01-13: 4 mg via INTRAVENOUS

## 2019-01-13 MED ORDER — MORPHINE SULFATE (PF) 2 MG/ML IV SOLN
1.0000 mg | INTRAVENOUS | Status: DC | PRN
  Administered 2019-01-13 (×2): 1 mg via INTRAVENOUS
  Filled 2019-01-13 (×2): qty 1

## 2019-01-13 MED ORDER — HEMOSTATIC AGENTS (NO CHARGE) OPTIME
TOPICAL | Status: DC | PRN
  Administered 2019-01-13 (×2): 1 via TOPICAL

## 2019-01-13 MED ORDER — OXYCODONE HCL 5 MG PO TABS
5.0000 mg | ORAL_TABLET | ORAL | Status: DC | PRN
  Administered 2019-01-13: 5 mg via ORAL

## 2019-01-13 MED ORDER — ACETAMINOPHEN 10 MG/ML IV SOLN
INTRAVENOUS | Status: AC
  Filled 2019-01-13: qty 100

## 2019-01-13 MED ORDER — LIDOCAINE 2% (20 MG/ML) 5 ML SYRINGE
INTRAMUSCULAR | Status: AC
  Filled 2019-01-13: qty 5

## 2019-01-13 MED ORDER — TRANEXAMIC ACID-NACL 1000-0.7 MG/100ML-% IV SOLN
INTRAVENOUS | Status: DC | PRN
  Administered 2019-01-13: 1000 mg via INTRAVENOUS

## 2019-01-13 MED ORDER — 0.9 % SODIUM CHLORIDE (POUR BTL) OPTIME
TOPICAL | Status: DC | PRN
  Administered 2019-01-13: 1000 mL

## 2019-01-13 MED ORDER — DEXAMETHASONE SODIUM PHOSPHATE 10 MG/ML IJ SOLN
INTRAMUSCULAR | Status: AC
  Filled 2019-01-13: qty 2

## 2019-01-13 MED ORDER — SUCCINYLCHOLINE CHLORIDE 200 MG/10ML IV SOSY
PREFILLED_SYRINGE | INTRAVENOUS | Status: AC
  Filled 2019-01-13: qty 10

## 2019-01-13 MED ORDER — EPHEDRINE SULFATE-NACL 50-0.9 MG/10ML-% IV SOSY
PREFILLED_SYRINGE | INTRAVENOUS | Status: DC | PRN
  Administered 2019-01-13: 10 mg via INTRAVENOUS
  Administered 2019-01-13: 5 mg via INTRAVENOUS

## 2019-01-13 MED ORDER — LISINOPRIL 20 MG PO TABS
40.0000 mg | ORAL_TABLET | Freq: Every day | ORAL | Status: DC
  Administered 2019-01-13 – 2019-01-14 (×2): 40 mg via ORAL
  Filled 2019-01-13 (×2): qty 2

## 2019-01-13 MED ORDER — LIDOCAINE 2% (20 MG/ML) 5 ML SYRINGE
INTRAMUSCULAR | Status: DC | PRN
  Administered 2019-01-13: 80 mg via INTRAVENOUS

## 2019-01-13 MED ORDER — ALBUTEROL SULFATE (2.5 MG/3ML) 0.083% IN NEBU: 2.5000 mg | INHALATION_SOLUTION | RESPIRATORY_TRACT | Status: DC | PRN

## 2019-01-13 MED ORDER — ROCURONIUM BROMIDE 10 MG/ML (PF) SYRINGE
PREFILLED_SYRINGE | INTRAVENOUS | Status: AC
  Filled 2019-01-13: qty 20

## 2019-01-13 MED ORDER — MIDAZOLAM HCL 5 MG/5ML IJ SOLN
INTRAMUSCULAR | Status: DC | PRN
  Administered 2019-01-13: 2 mg via INTRAVENOUS

## 2019-01-13 MED ORDER — PHENOL 1.4 % MT LIQD: 1.0000 | OROMUCOSAL | Status: DC | PRN

## 2019-01-13 MED ORDER — ONDANSETRON HCL 4 MG/2ML IJ SOLN: 4.0000 mg | Freq: Four times a day (QID) | INTRAMUSCULAR | Status: DC | PRN

## 2019-01-13 MED ORDER — PHENYLEPHRINE 40 MCG/ML (10ML) SYRINGE FOR IV PUSH (FOR BLOOD PRESSURE SUPPORT)
PREFILLED_SYRINGE | INTRAVENOUS | Status: DC | PRN
  Administered 2019-01-13: 160 ug via INTRAVENOUS
  Administered 2019-01-13: 120 ug via INTRAVENOUS
  Administered 2019-01-13: 80 ug via INTRAVENOUS

## 2019-01-13 MED ORDER — THROMBIN 20000 UNITS EX KIT
PACK | CUTANEOUS | Status: AC
  Filled 2019-01-13: qty 1

## 2019-01-13 MED ORDER — OXYCODONE-ACETAMINOPHEN 10-325 MG PO TABS: 1.0000 | ORAL_TABLET | Freq: Four times a day (QID) | ORAL | 0 refills | Status: AC | PRN

## 2019-01-13 MED ORDER — FENTANYL CITRATE (PF) 100 MCG/2ML IJ SOLN
25.0000 ug | INTRAMUSCULAR | Status: DC | PRN
  Administered 2019-01-13: 14:00:00 50 ug via INTRAVENOUS
  Administered 2019-01-13: 14:00:00 25 ug via INTRAVENOUS
  Administered 2019-01-13: 14:00:00 75 ug via INTRAVENOUS

## 2019-01-13 MED ORDER — ONDANSETRON HCL 4 MG PO TABS: 4.0000 mg | ORAL_TABLET | Freq: Four times a day (QID) | ORAL | Status: DC | PRN

## 2019-01-13 MED ORDER — PROPOFOL 10 MG/ML IV BOLUS
INTRAVENOUS | Status: AC
  Filled 2019-01-13: qty 20

## 2019-01-13 MED ORDER — OXYCODONE HCL 5 MG PO TABS
ORAL_TABLET | ORAL | Status: AC
  Filled 2019-01-13: qty 3

## 2019-01-13 MED ORDER — TRANEXAMIC ACID-NACL 1000-0.7 MG/100ML-% IV SOLN
INTRAVENOUS | Status: AC
  Filled 2019-01-13: qty 100

## 2019-01-13 MED ORDER — HYDROMORPHONE HCL 1 MG/ML IJ SOLN
1.0000 mg | INTRAMUSCULAR | Status: DC | PRN
  Administered 2019-01-13: 1 mg via INTRAVENOUS

## 2019-01-13 MED ORDER — SUCCINYLCHOLINE CHLORIDE 200 MG/10ML IV SOSY
PREFILLED_SYRINGE | INTRAVENOUS | Status: DC | PRN
  Administered 2019-01-13: 130 mg via INTRAVENOUS

## 2019-01-13 MED ORDER — OXYCODONE HCL 5 MG PO TABS
10.0000 mg | ORAL_TABLET | ORAL | Status: DC | PRN
  Administered 2019-01-13 – 2019-01-14 (×6): 10 mg via ORAL
  Filled 2019-01-13 (×6): qty 2

## 2019-01-13 MED ORDER — SODIUM CHLORIDE 0.9% FLUSH
3.0000 mL | Freq: Two times a day (BID) | INTRAVENOUS | Status: DC
  Administered 2019-01-13 – 2019-01-14 (×2): 3 mL via INTRAVENOUS

## 2019-01-13 MED ORDER — PHENYLEPHRINE 40 MCG/ML (10ML) SYRINGE FOR IV PUSH (FOR BLOOD PRESSURE SUPPORT)
PREFILLED_SYRINGE | INTRAVENOUS | Status: AC
  Filled 2019-01-13: qty 10

## 2019-01-13 MED ORDER — MIDAZOLAM HCL 2 MG/2ML IJ SOLN
INTRAMUSCULAR | Status: AC
  Filled 2019-01-13: qty 2

## 2019-01-13 MED ORDER — MENTHOL 3 MG MT LOZG: 1.0000 | LOZENGE | OROMUCOSAL | Status: DC | PRN

## 2019-01-13 MED ORDER — DEXAMETHASONE SODIUM PHOSPHATE 4 MG/ML IJ SOLN
4.0000 mg | Freq: Four times a day (QID) | INTRAMUSCULAR | Status: AC
  Administered 2019-01-13 – 2019-01-14 (×2): 4 mg via INTRAVENOUS
  Filled 2019-01-13 (×2): qty 1

## 2019-01-13 MED ORDER — ROCURONIUM BROMIDE 50 MG/5ML IV SOSY
PREFILLED_SYRINGE | INTRAVENOUS | Status: DC | PRN
  Administered 2019-01-13: 20 mg via INTRAVENOUS
  Administered 2019-01-13: 50 mg via INTRAVENOUS
  Administered 2019-01-13: 10 mg via INTRAVENOUS
  Administered 2019-01-13: 20 mg via INTRAVENOUS

## 2019-01-13 MED ORDER — LACTATED RINGERS IV SOLN: INTRAVENOUS | Status: DC

## 2019-01-13 MED ORDER — CEFAZOLIN SODIUM-DEXTROSE 2-4 GM/100ML-% IV SOLN
INTRAVENOUS | Status: AC
  Filled 2019-01-13: qty 100

## 2019-01-13 MED ORDER — METHOCARBAMOL 500 MG PO TABS: 500.0000 mg | ORAL_TABLET | Freq: Three times a day (TID) | ORAL | 0 refills | Status: AC | PRN

## 2019-01-13 MED ORDER — LACTATED RINGERS IV SOLN
INTRAVENOUS | Status: DC
  Administered 2019-01-13 (×2): via INTRAVENOUS

## 2019-01-13 MED ORDER — PROPOFOL 10 MG/ML IV BOLUS
INTRAVENOUS | Status: DC | PRN
  Administered 2019-01-13: 180 mg via INTRAVENOUS

## 2019-01-13 MED ORDER — METHOCARBAMOL 500 MG PO TABS
500.0000 mg | ORAL_TABLET | Freq: Four times a day (QID) | ORAL | Status: DC | PRN
  Administered 2019-01-13 – 2019-01-14 (×4): 500 mg via ORAL
  Filled 2019-01-13 (×3): qty 1

## 2019-01-13 MED ORDER — SODIUM CHLORIDE 0.9% FLUSH: 3.0000 mL | INTRAVENOUS | Status: DC | PRN

## 2019-01-13 MED ORDER — ACETAMINOPHEN 650 MG RE SUPP: 650.0000 mg | RECTAL | Status: DC | PRN

## 2019-01-13 MED ORDER — GABAPENTIN 300 MG PO CAPS
300.0000 mg | ORAL_CAPSULE | Freq: Three times a day (TID) | ORAL | Status: DC
  Administered 2019-01-13 – 2019-01-14 (×3): 300 mg via ORAL
  Filled 2019-01-13 (×3): qty 1

## 2019-01-13 MED ORDER — HYDROMORPHONE HCL 1 MG/ML IJ SOLN
INTRAMUSCULAR | Status: AC
  Filled 2019-01-13: qty 1

## 2019-01-13 MED ORDER — METHOCARBAMOL 1000 MG/10ML IJ SOLN
500.0000 mg | Freq: Four times a day (QID) | INTRAVENOUS | Status: DC | PRN
  Filled 2019-01-13 (×2): qty 5

## 2019-01-13 MED ORDER — BUPIVACAINE-EPINEPHRINE (PF) 0.25% -1:200000 IJ SOLN
INTRAMUSCULAR | Status: AC
  Filled 2019-01-13: qty 30

## 2019-01-13 MED ORDER — HYDROMORPHONE HCL 2 MG PO TABS: 1.0000 mg | ORAL_TABLET | ORAL | Status: DC | PRN

## 2019-01-13 SURGICAL SUPPLY — 70 items
BIT DRILL NEURO 2X3.1 SFT TUCH (MISCELLANEOUS) IMPLANT
BLADE CLIPPER SURG (BLADE) IMPLANT
BONE VIVIGEN FORMABLE 1.3CC (Bone Implant) ×4 IMPLANT
BUR EGG ELITE 4.0 (BURR) IMPLANT
BUR MATCHSTICK NEURO 3.0 LAGG (BURR) IMPLANT
CABLE BIPOLOR RESECTION CORD (MISCELLANEOUS) ×2 IMPLANT
CAGE CERV NL SML 8 6D (Cage) ×2 IMPLANT
CANISTER SUCT 3000ML PPV (MISCELLANEOUS) ×2 IMPLANT
CLSR STERI-STRIP ANTIMIC 1/2X4 (GAUZE/BANDAGES/DRESSINGS) ×2 IMPLANT
COVER MAYO STAND STRL (DRAPES) ×6 IMPLANT
COVER PLATE (Plate) ×2 IMPLANT
COVER SURGICAL LIGHT HANDLE (MISCELLANEOUS) ×4 IMPLANT
COVER WAND RF STERILE (DRAPES) ×2 IMPLANT
CRADLE DONUT ADULT HEAD (MISCELLANEOUS) ×2 IMPLANT
DEVICE FUSION NANLCK 8MM 6DEG (Cage) ×1 IMPLANT
DRAPE C-ARM 42X72 X-RAY (DRAPES) ×2 IMPLANT
DRAPE MICROSCOPE LEICA 46X105 (MISCELLANEOUS) IMPLANT
DRAPE POUCH INSTRU U-SHP 10X18 (DRAPES) ×2 IMPLANT
DRAPE SURG 17X23 STRL (DRAPES) ×2 IMPLANT
DRAPE U-SHAPE 47X51 STRL (DRAPES) ×2 IMPLANT
DRILL NEURO 2X3.1 SOFT TOUCH (MISCELLANEOUS)
DRSG OPSITE POSTOP 3X4 (GAUZE/BANDAGES/DRESSINGS) ×2 IMPLANT
DURAPREP 26ML APPLICATOR (WOUND CARE) ×2 IMPLANT
ELECT COATED BLADE 2.86 ST (ELECTRODE) ×2 IMPLANT
ELECT PENCIL ROCKER SW 15FT (MISCELLANEOUS) ×2 IMPLANT
ELECT REM PT RETURN 9FT ADLT (ELECTROSURGICAL) ×2
ELECTRODE REM PT RTRN 9FT ADLT (ELECTROSURGICAL) ×1 IMPLANT
FLOSEAL 10ML (HEMOSTASIS) IMPLANT
FUSION TCS NANOLOCK 8MM 6DEG (Cage) ×2 IMPLANT
GLOVE BIO SURGEON STRL SZ 6.5 (GLOVE) ×2 IMPLANT
GLOVE BIOGEL PI IND STRL 6.5 (GLOVE) ×1 IMPLANT
GLOVE BIOGEL PI IND STRL 8.5 (GLOVE) ×1 IMPLANT
GLOVE BIOGEL PI INDICATOR 6.5 (GLOVE) ×1
GLOVE BIOGEL PI INDICATOR 8.5 (GLOVE) ×1
GLOVE SS BIOGEL STRL SZ 8.5 (GLOVE) ×1 IMPLANT
GLOVE SUPERSENSE BIOGEL SZ 8.5 (GLOVE) ×1
GOWN STRL REUS W/ TWL LRG LVL3 (GOWN DISPOSABLE) ×1 IMPLANT
GOWN STRL REUS W/TWL 2XL LVL3 (GOWN DISPOSABLE) ×2 IMPLANT
GOWN STRL REUS W/TWL LRG LVL3 (GOWN DISPOSABLE) ×1
KIT BASIN OR (CUSTOM PROCEDURE TRAY) ×2 IMPLANT
KIT TURNOVER KIT B (KITS) ×2 IMPLANT
NEEDLE HYPO 22GX1.5 SAFETY (NEEDLE) ×2 IMPLANT
NEEDLE SPNL 18GX3.5 QUINCKE PK (NEEDLE) ×2 IMPLANT
NS IRRIG 1000ML POUR BTL (IV SOLUTION) ×2 IMPLANT
PACK ORTHO CERVICAL (CUSTOM PROCEDURE TRAY) ×2 IMPLANT
PACK UNIVERSAL I (CUSTOM PROCEDURE TRAY) ×2 IMPLANT
PAD ARMBOARD 7.5X6 YLW CONV (MISCELLANEOUS) ×4 IMPLANT
PATTIES SURGICAL .25X.25 (GAUZE/BANDAGES/DRESSINGS) ×2 IMPLANT
PIN DISTRACTION 14 (PIN) ×4 IMPLANT
PLATE LOCK ENDO TCS (Plate) ×2 IMPLANT
RESTRAINT LIMB HOLDER UNIV (RESTRAINTS) ×2 IMPLANT
RUBBERBAND STERILE (MISCELLANEOUS) IMPLANT
SCREW 3.8X16MM (Screw) ×2 IMPLANT
SCREW LOCKING 14MMX3.5MM (Screw) ×6 IMPLANT
SPONGE INTESTINAL PEANUT (DISPOSABLE) ×2 IMPLANT
SPONGE LAP 4X18 RFD (DISPOSABLE) ×4 IMPLANT
SPONGE SURGIFOAM ABS GEL 100 (HEMOSTASIS) ×2 IMPLANT
SUT BONE WAX W31G (SUTURE) ×2 IMPLANT
SUT MON AB 3-0 SH 27 (SUTURE) ×1
SUT MON AB 3-0 SH27 (SUTURE) ×1 IMPLANT
SUT SILK 2 0 (SUTURE)
SUT SILK 2-0 18XBRD TIE 12 (SUTURE) IMPLANT
SUT VIC AB 2-0 CT1 18 (SUTURE) ×2 IMPLANT
SYR BULB IRRIGATION 50ML (SYRINGE) ×2 IMPLANT
SYR CONTROL 10ML LL (SYRINGE) ×2 IMPLANT
TAPE CLOTH 4X10 WHT NS (GAUZE/BANDAGES/DRESSINGS) ×2 IMPLANT
TAPE UMBILICAL COTTON 1/8X30 (MISCELLANEOUS) ×2 IMPLANT
TOWEL GREEN STERILE (TOWEL DISPOSABLE) ×2 IMPLANT
TOWEL GREEN STERILE FF (TOWEL DISPOSABLE) ×2 IMPLANT
WATER STERILE IRR 1000ML POUR (IV SOLUTION) ×2 IMPLANT

## 2019-01-13 NOTE — Op Note (Signed)
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  Prior C3-5 ACDF.  Postoperative diagnosis: Same  Operative procedure: ACDF K5-3  Complications: None  First assistant: Cleta Alberts, PA  Implants: Medtronic Nano lock 0 profile intervertebral cage.  C5-6: 8 mm small lordotic.  C6-7: 8 mm medium lordotic.  Allograft:  vivogen  Indications: Billy Shaffer is a very pleasant 53 year old gentleman who several years ago had a 2 level ACDF for cervical spondylitic radiculopathy.  Patient did well until a recent accident when he began having significant radicular C7 arm pain.  Imaging studies demonstrated foraminal stenosis on the left side causing C7 nerve irritation.  Attempts at conservative management had failed to alleviate his symptoms and the only thing that provided temporary relief was a C7 selective nerve root block.  As a result of the ongoing debilitating pain and loss in quality of life and the failure to improve with conservative measures we elected to move forward with a revision ACDF.  Because of the previous 3-5 ACDF we elected to address both levels.  Even though there was not significant pathology at C5-6 I did not want a leave this is a space between 2 fusion masses.  I discussed this with the patient and his wife and they agreed with the surgical plan and expressed an understanding of the risks and benefits.  Operative report: Patient was brought the operating room placed upon the operating room table.  After successful induction of general anesthesia and endotracheal patient teds SCDs and a Foley were inserted.  The anterior cervical spine was prepped and draped in a standard fashion.  Timeout was taken to confirm patient procedure and all other important data.  Identified the C5-6 and C6-7 disc spaces and marked out my incision on the right side of the spine.  A standard [place]-Robinson approach via longitudinal incision was undertaken.  I anesthetized the  skin with quarter percent Marcaine with epinephrine and then made a longitudinal incision along the sternocleidomastoid.  Sharp dissection was carried out down to the platysma.  I sharply incised the platysma and continued to sharply dissect through the deep cervical fascia.  Identified the omohyoid and released it from its sleeve but I did not need to sacrifice it.  I continued bluntly dissecting until I could palpate the carotid sheath.  Once the artery was palpated I was able to sweep the esophagus to the right and protected with an appendiceal.  Using Kitner dissectors I mobilized the remaining prevertebral fascia and expose the anterior longitudinal ligament.  Once I had adequate exposure I then took an x-ray to confirm I was at the C5-6 level.  The large anterior overhanging osteophyte was resected to expose the anterior surface of the disc.  Self-retaining retractors were placed underneath the longus coli muscle.  The endotracheal cuff was deflated the retractor was expanded to the appropriate width and the endotracheal cuff was reinflated.  An annulotomy was performed and using pituitary rongeurs and curettes I removed all of the disc material.  Using curettes I began releasing the posterior annulus.  I was able to undercut underneath the uncovertebral joint.  Once I had removed all the cartilaginous endplate and had bleeding subchondral bone and adequate discectomy I rasped the endplates and then used my trial devices.  I elected use the size 8 small lordotic cage.  This was packed with the allograft and malleted to the appropriate depth.  X-rays demonstrated satisfactory overall position.  I used the broach and then placed the 14  mm 3.5 diameter screw through the 0 profile plate and into the C5 vertebral body second screw was placed through the cage and into the C6 vertebral body.  Both screws had excellent purchase.  The anterior locking plate was then applied and secured according manufacture standards.   With the C5-6 ACDF complete I repositioned my retractors to expose the C6-7 disc space.  Using the same exact technique I performed a complete discectomy at C6-7.  Once the annulus was released I was able to undercut under the uncovertebral joint especially on the left-hand side as this was the symptomatic side.  I then checked using live fluoroscopy that I had parallel endplate distraction and adequate restoration of the foraminal volume.  At this point with the cartilaginous endplate removed and I confirmed bleeding subchondral bone I proceeded with the trials.  At this time I use the medium size 8 lordotic spacer.  This provided an excellent fit and maintain the foraminal decompression.  At this point I obtain the appropriate size spacer packed it with the allograft and malleted to the appropriate depth.  14 mm 3.5 diameter screw was used to secure it into the body of C6 and a 3.8 x 16 mm length was used to affix it to the body of C7.  Again both screws had excellent purchase.  The top locking plate was then applied and secured according manufacture standards.  At this point with the discectomy complete I took my final x-rays.  Demonstrate improved foraminal volume on the AP and lateral views.  I copiously irrigated the wound with normal saline and made sure that hemostasis using proper electrocautery, and bone wax for the bleeding bone edges.  FloSeal was then inserted and then I again copiously irrigated the wound with normal saline.  I confirmed that hemostasis.  I then returned the trachea and esophagus to midline and closed the platysma with interrupted 2-0 Vicryl suture.  Skin was closed with 3-0 Monocryl Steri-Strips dry dressing and an Aspen collar were applied.  Patient was ultimately extubated transferred to the PACU without incident.  The end of the case all needle sponge counts were correct.

## 2019-01-13 NOTE — Discharge Instructions (Signed)
° ° ° ° ° °Today you will be discharged from the hospital.  The purpose of the following handout is to help guide you over the next 2 weeks.  First and foremost, be sure you have a follow up appointment with Billy Shaffer 2 weeks from the time of your surgery to have your sutures removed.  Please call [facility] [phone] to schedule or confirm this appointment.   ° ° ° °Brace °You do not have to wear the collar while lying in bed or sitting in a high-backed chair, eating, sleeping or showering.  Other than these instances, you must wear the brace.  You may NOT wear the collar while driving a vehicle (see driving restrictions below).  It is advisable that you wear the collar in public places or while traveling in a car as a passenger.  Billy Shaffer will discuss further use of the collar at your 2 week postop visit. ° °Wound Care °You may SHOWER 5 days from the date of surgery.  Shower directly over the steri-strips.  DO NOT scrub or submerge (bath tub, swimming pool, hot tub, etc.) the area.  Pat to dry following your shower.  There is no need for additional dressings other than the steri-strips.  Allow the steri-strips to fall off on their own.  Once the strips have fallen off, you may leave the area undressed.  DO NOT apply lotion/cream/ointment to the area.  The wound must remain dry at all times other than while showering.  Billy Shaffer or his staff will remove your stiches at your first postop visit and give you additional instructions regarding wound care at that time.  ° °Activity °NO DRIVING FOR 2 WEEKS.  No lifting over 5 pounds (approximately a gallon of milk).  No bending, stooping, squatting or twisting.  No overhead activities.  We encourage you to walk (short distances and often throughout the day) as you can tolerate.  A good rule of thumb is to get up and move once or twice every hour.  You may go up and down stairs carefully.  As you continue to recover, Billy Shaffer will address and  adjust restrictions to your activities until no further restrictions are needed.  However, until your first postop visit, when Billy Shaffer can assess your recovery, you are to follow these instructions.  At the end of this document is a tentative outline of activities for up to 1 year.   ° ° ° ° °Medication °You will be discharged from the hospital with medication for pain, spasm, nausea and constipation.  You will be given enough medication to last until your first postop visit in 2 weeks.  Medications WILL NOT BE REFILLED EARLY; therefore, you are to take the medications only as directed.  If you have been given multiple prescriptions, please leave them with your pharmacy.  They can keep them on file for when you need them.  Medications that are lost or stolen WILL NOT be replaced.  We will address the need for continuing certain medications on an individual basis during your postop visit.  We ask that you avoid over the counter anti-inflammatory medications (Advil, Aleve, Motrin) for 3 months.   ° °What you can expect following neck surgery... °It is not uncommon to experience a sore throat or difficulty swallowing following neck surgery.  Cold liquids and soft foods are helpful in soothing this discomfort.  There is no specific diet that you are to follow after surgery, however, there are a   few things you should keep in mind to avoid unneeded discomfort.  Take small bites and eat slowly.  Chew your food thoroughly before swallowing.  ° °It is not uncommon to experience incisional soreness or pain in the back of the neck, shoulders or between the shoulder blades.  These symptoms will slowly begin to resolve as you continue to recover, however, they can last for a few weeks.   ° °It is not uncommon to experience INTERMITTENT arm pain following surgery.  This pain can mimic the arm pain you had prior to surgery.  As long as the pain resolves on its own and is not constant, there is no need to become alarmed.   ° °When To Call °If you experience fever >101F, loss of bowel or bladder control, painful swelling in the lower extremities, constant (unresolving) arm pain.  If you experience any of these symptoms, please call [facility] [phone]. ° °What's Next °As mentioned earlier, you will follow up with Billy Shaffer in 2 weeks.  At that time, we will likely remove your stitches and discuss additional aspects of your recovery.   ° ° ° ° ° ° ° ° ° ° ° ° ° ° ° ° °ACTIVITY GUIDELINES ANTERIOR CERVICAL DISECTOMY AND FUSION ° °Activity Discharge 2 weeks 6 weeks 3 months 6 months 1 year  °Shower 5 days        °Submerge the wound  no no yes     °Walking outdoors yes       °Lifting 5 lbs yes       °Climbing stairs yes       °Cooking yes       °Car rides (less than 30 minutes) yes       °Car rides (greater than 30 minutes) no varies yes     °Air travel no varies yes     °Short outings (church, visits, etc...) yes       °School no no yes     °Driving a car no no varies yes    °Light upper extremity exercises no no varies yes    °Stationary bike no no yes     °Swimming (no diving) no no no varies yes   °Vacuuming, laundry, mopping no no no varies yes   °Biking outdoors no no no no varies yes  °Light jogging no no no varies yes   °Low impact aerobics no no no varies yes   °Non-contact sports (tennis, golf) no no no varies yes   °Hunting (no tree climbing) no no no varies yes   °Dancing (non-gymnastics) no no no varies yes   °Down-hill skiing (experienced skier) no no no no yes   °Down-hill skiing (novice) no no no no yes   °Cross-country skiing no no no no yes   °Horseback riding (noncompetitive)  no no no no yes   °Horseback riding (competitive) no no no no varies yes  °Gardening/landscaping no no no varies yes   °House repairs no no no varies varies yes  °Lifting up to 50 lbs no no no no varies yes  ° ° ° ° ° ° ° ° °

## 2019-01-13 NOTE — Progress Notes (Signed)
Patient's complaint of pain uncontrolled despite morphine IV and oxycodone. Pt further complained that its harder for him to swallow now compared to earlier. Surgical incision assessed, with slight swelling noted, soft to touch, no hematoma noted. Continued head elevation to at least 30 degrees and ice application. On call paged, Alfredo Martinez  PA called back with new order : Dilaudid 1mg  q 2 hr prn; d/c morphine; decadron 4 mg iv q 6 x2 doses. Order carried out.

## 2019-01-13 NOTE — Anesthesia Procedure Notes (Signed)
Procedure Name: Intubation Date/Time: 01/13/2019 9:40 AM Performed by: Shirlyn Goltz, CRNA Pre-anesthesia Checklist: Patient identified, Emergency Drugs available, Suction available and Patient being monitored Patient Re-evaluated:Patient Re-evaluated prior to induction Oxygen Delivery Method: Circle system utilized Preoxygenation: Pre-oxygenation with 100% oxygen Induction Type: IV induction and Rapid sequence Laryngoscope Size: Glidescope and 4 Grade View: Grade I Tube type: Oral Tube size: 7.5 mm Number of attempts: 1 Airway Equipment and Method: Video-laryngoscopy and Rigid stylet Placement Confirmation: ETT inserted through vocal cords under direct vision,  positive ETCO2 and breath sounds checked- equal and bilateral Secured at: 22 cm Tube secured with: Tape Dental Injury: Teeth and Oropharynx as per pre-operative assessment  Comments: Elective glidescope; ACDF surgery.  Also glidescope was used previously

## 2019-01-13 NOTE — H&P (Signed)
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  I have gone over the surgery as well as the risks and benefits and he is expressed a continued understanding.  All of his questions have been addressed.  Patient's clinical exam is unchanged from his last office visit of 01/07/19.

## 2019-01-13 NOTE — Brief Op Note (Signed)
01/13/2019  1:16 PM  PATIENT:  Billy Shaffer  53 y.o. male  PRE-OPERATIVE DIAGNOSIS:  Cervical spondylosis w/ radiclopathy  POST-OPERATIVE DIAGNOSIS:  Cervical spondylosis w/ radiclopathy  PROCEDURE:  Procedure(s) with comments: ANTERIOR CERVICAL DECOMPRESSION/DISCECTOMY FUSION C5-7 (N/A) - 180 mins  SURGEON:  Surgeon(s) and Role:    Melina Schools, MD - Primary  PHYSICIAN ASSISTANT:   ASSISTANTS: Amanda Ward, PA   ANESTHESIA:   general  EBL:  50 mL   BLOOD ADMINISTERED:none  DRAINS: none   LOCAL MEDICATIONS USED:  MARCAINE      SPECIMEN:  No Specimen  DISPOSITION OF SPECIMEN:  N/A  COUNTS:  YES  TOURNIQUET:  * No tourniquets in log *  DICTATION: .Dragon Dictation  PLAN OF CARE: Admit for overnight observation  PATIENT DISPOSITION:  PACU - hemodynamically stable.

## 2019-01-13 NOTE — Progress Notes (Signed)
Physical Therapy Evaluation and Discharge Patient Details Name: Billy Shaffer MRN: [id number] DOB: May 11, 1966 Today's Date: 01/13/2019   History of Present Illness  Pt is a 53 y/o male s/p C5-7 ACDF. PMH includes asthma, HTN, and previous ACDF.   Clinical Impression  Patient evaluated by Physical Therapy with no further acute PT needs identified. All education has been completed and the patient has no further questions. Pt overall steady with gait and stair navigation. Pt at a mod I to independent level. Reviewed cervical precautions and generalized walking program. See below for any follow-up Physical Therapy or equipment needs. PT is signing off. Thank you for this referral. If needs change, please re-consult.      Follow Up Recommendations No PT follow up    Equipment Recommendations  None recommended by PT    Recommendations for Other Services       Precautions / Restrictions Precautions Precautions: Cervical Precaution Booklet Issued: Yes (comment) Precaution Comments: Reviewed cervical precautions with pt.  Required Braces or Orthoses: Cervical Brace Cervical Brace: Hard collar Restrictions Weight Bearing Restrictions: No      Mobility  Bed Mobility Overal bed mobility: Modified Independent             General bed mobility comments: Pt familiar with log roll technique from previous surgery and was able to perform without cues.   Transfers Overall transfer level: Independent Equipment used: None                Ambulation/Gait Ambulation/Gait assistance: Independent Gait Distance (Feet): 300 Feet Assistive device: None Gait Pattern/deviations: WFL(Within Functional Limits) Gait velocity: WFL    General Gait Details: Overall steady gait with good gait speed. No LOB noted. Educated about generalized walking program to perform at home.   Stairs Stairs: Yes Stairs assistance: Modified independent (Device/Increase time) Stair Management: One rail  Right;Alternating pattern;Forwards Number of Stairs: 10 General stair comments: Overall steady stair navigation. No LOB noted.   Wheelchair Mobility    Modified Rankin (Stroke Patients Only)       Balance Overall balance assessment: No apparent balance deficits (not formally assessed)                                           Pertinent Vitals/Pain Pain Assessment: Faces Faces Pain Scale: Hurts little more Pain Location: neck  Pain Descriptors / Indicators: Aching;Operative site guarding Pain Intervention(s): Limited activity within patient's tolerance;Monitored during session;Repositioned    Home Living Family/patient expects to be discharged to:: Private residence Living Arrangements: Other relatives Available Help at Discharge: Family;Available 24 hours/day Type of Home: House Home Access: Stairs to enter Entrance Stairs-Rails: Left Entrance Stairs-Number of Steps: 2 Home Layout: Multi-level;Able to live on main level with bedroom/bathroom Home Equipment: None      Prior Function Level of Independence: Independent               Hand Dominance        Extremity/Trunk Assessment   Upper Extremity Assessment Upper Extremity Assessment: Overall WFL for tasks assessed    Lower Extremity Assessment Lower Extremity Assessment: Overall WFL for tasks assessed    Cervical / Trunk Assessment Cervical / Trunk Assessment: Other exceptions Cervical / Trunk Exceptions: s/p ACDF   Communication   Communication: No difficulties  Cognition Arousal/Alertness: Awake/alert Behavior During Therapy: WFL for tasks assessed/performed Overall Cognitive Status: Within Functional Limits for tasks assessed  General Comments      Exercises     Assessment/Plan    PT Assessment Patent does not need any further PT services  PT Problem List         PT Treatment Interventions      PT Goals  (Current goals can be found in the Care Plan section)  Acute Rehab PT Goals Patient Stated Goal: to go home PT Goal Formulation: All assessment and education complete, DC therapy Time For Goal Achievement: 01/13/19 Potential to Achieve Goals: Good    Frequency     Barriers to discharge        Co-evaluation               AM-PAC PT "6 Clicks" Mobility  Outcome Measure Help needed turning from your back to your side while in a flat bed without using bedrails?: None Help needed moving from lying on your back to sitting on the side of a flat bed without using bedrails?: None Help needed moving to and from a bed to a chair (including a wheelchair)?: None Help needed standing up from a chair using your arms (e.g., wheelchair or bedside chair)?: None Help needed to walk in hospital room?: None Help needed climbing 3-5 steps with a railing? : None 6 Click Score: 24    End of Session Equipment Utilized During Treatment: Gait belt;Cervical collar Activity Tolerance: Patient tolerated treatment well Patient left: in bed;with call bell/phone within reach Nurse Communication: Mobility status PT Visit Diagnosis: Other abnormalities of gait and mobility (R26.89)    Time: [id number] PT Time Calculation (min) (ACUTE ONLY): 13 min   Charges:   PT Evaluation $PT Eval Low Complexity: 1 Low          Gladys DammeBrittany Jaqulyn Chancellor, PT, DPT  Acute Rehabilitation Services  Pager: [phone] Office: [phone]   Lehman PromBrittany S Carnetta Losada 01/13/2019, 4:57 PM

## 2019-01-13 NOTE — Transfer of Care (Signed)
Immediate Anesthesia Transfer of Care Note  Patient: Billy Shaffer  Procedure(s) Performed: ANTERIOR CERVICAL DECOMPRESSION/DISCECTOMY FUSION C5-7 (N/A Neck)  Patient Location: PACU  Anesthesia Type:General  Level of Consciousness: awake, alert  and oriented  Airway & Oxygen Therapy: Patient Spontanous Breathing and Patient connected to nasal cannula oxygen  Post-op Assessment: Report given to RN and Post -op Vital signs reviewed and stable  Post vital signs: Reviewed and stable  Last Vitals:  Vitals Value Taken Time  BP 155/89 01/13/19 1330  Temp    Pulse    Resp 12 01/13/19 1334  SpO2    Vitals shown include unvalidated device data.  Last Pain:  Vitals:   01/13/19 0847  TempSrc:   PainSc: 0-No pain         Complications: No apparent anesthesia complications

## 2019-01-14 DIAGNOSIS — M4722 Other spondylosis with radiculopathy, cervical region: Secondary | ICD-10-CM | POA: Diagnosis not present

## 2019-01-14 NOTE — Progress Notes (Signed)
Subjective: 1 Day Post-Op Procedure(s) (LRB): ANTERIOR CERVICAL DECOMPRESSION/DISCECTOMY FUSION C5-7 (N/A) Patient reports pain as moderate.  Required dilaudid and decadron last night. Patient requesting more IM/IV medications today. Describes pain as posterior between shoulder blades. Arm pain improved. Tolerating PO w/o N/V +void +flatus, - BM Ambulating without assistance.  Denies HA, dizziness, CP, SOB, calf pain, fever/sweats/chills.  Dressed and eager for discharge.  Objective: Vital signs in last 24 hours: Temp:  [97.4 F (36.3 C)-98.1 F (36.7 C)] 97.6 F (36.4 C) (06/19 1200) Pulse Rate:  [73-92] 73 (06/19 1200) Resp:  [16-20] 19 (06/19 1200) BP: (114-129)/(66-89) 120/73 (06/19 1200) SpO2:  [94 %-98 %] 94 % (06/19 1200) Weight:  [102 kg] 102 kg (06/19 1018)  Intake/Output from previous day: 06/18 0701 - 06/19 0700 In: 1250 [P.O.:100; I.V.:1150] Out: 800 [Urine:750; Blood:50] Intake/Output this shift: Total I/O In: 600 [P.O.:600] Out: -   No results for input(s): HGB in the last 72 hours. No results for input(s): WBC, RBC, HCT, PLT in the last 72 hours. No results for input(s): NA, K, CL, CO2, BUN, CREATININE, GLUCOSE, CALCIUM in the last 72 hours. No results for input(s): LABPT, INR in the last 72 hours.  Neurologically intact ABD soft Neurovascular intact Sensation intact distally Intact pulses distally Dorsiflexion/Plantar flexion intact Incision: dressing C/D/I   Assessment/Plan: 1 Day Post-Op Procedure(s) (LRB): ANTERIOR CERVICAL DECOMPRESSION/DISCECTOMY FUSION C5-7 (N/A)  DVT PPx: Teds, SCD's, ambulation Encourage IS Order for discharge home placed. Patient will be discharged home on PO Percocet, robaxin, zofran. No stronger pain medications will be given.   He will f/u in office 2 weeks s/p surgery.   Yvonne Kendall Ward 01/14/2019, 2:43 PM

## 2019-01-14 NOTE — Progress Notes (Signed)
Patient is discharged from room 3C05 at this time. Alert and in stable condition. IV site d/c'd and instructions read to patient with understanding verbalized. Left unit via ambulation with all belongings at side.

## 2019-01-14 NOTE — Anesthesia Postprocedure Evaluation (Signed)
Anesthesia Post Note  Patient: Billy Shaffer  Procedure(s) Performed: ANTERIOR CERVICAL DECOMPRESSION/DISCECTOMY FUSION C5-7 (N/A Neck)     Patient location during evaluation: PACU Anesthesia Type: General Level of consciousness: awake Pain management: pain level controlled Respiratory status: spontaneous breathing Cardiovascular status: stable Postop Assessment: no apparent nausea or vomiting Anesthetic complications: no    Last Vitals:  Vitals:   01/14/19 0741 01/14/19 1200  BP: 114/81 120/73  Pulse: 84 73  Resp: 19 19  Temp: 36.5 C 36.4 C  SpO2: 96% 94%    Last Pain:  Vitals:   01/14/19 1243  TempSrc:   PainSc: 5                  Takara Sermons

## 2019-01-14 NOTE — Plan of Care (Signed)
Patient awaiting discharge.

## 2019-01-14 NOTE — Evaluation (Signed)
Occupational Therapy Evaluation and Discharge Patient Details Name: Billy Shaffer MRN: [id number] DOB: 1965-08-27 Today's Date: 01/14/2019    History of Present Illness Pt is a 53 y/o male s/p C5-7 ACDF. PMH includes asthma, HTN, and previous ACDF.    Clinical Impression   All education completed with pt verbalizing understanding. Pt is functioning modified independently in ADL. His chief concern is pain management. No further OT needs.    Follow Up Recommendations  No OT follow up    Equipment Recommendations  None recommended by OT    Recommendations for Other Services       Precautions / Restrictions Precautions Precautions: Cervical Precaution Booklet Issued: Yes (comment) Precaution Comments: Reviewed cervical precautions with pt.  Required Braces or Orthoses: Cervical Brace Cervical Brace: Hard collar Restrictions Weight Bearing Restrictions: No      Mobility Bed Mobility Overal bed mobility: Modified Independent             General bed mobility comments: HOB up  Transfers Overall transfer level: Independent Equipment used: None                  Balance                                           ADL either performed or assessed with clinical judgement   ADL Overall ADL's : Modified independent                                       General ADL Comments: educated at length in body mechanics during ADL and IADL and adhering to precautions using compensatory strategies     Vision Patient Visual Report: No change from baseline       Perception     Praxis      Pertinent Vitals/Pain Pain Assessment: Faces Faces Pain Scale: Hurts even more Pain Location: neck  Pain Descriptors / Indicators: Aching;Operative site guarding Pain Intervention(s): Ice applied;Monitored during session     Hand Dominance Right   Extremity/Trunk Assessment Upper Extremity Assessment Upper Extremity Assessment: Overall WFL for  tasks assessed   Lower Extremity Assessment Lower Extremity Assessment: Overall WFL for tasks assessed   Cervical / Trunk Assessment Cervical / Trunk Exceptions: s/p ACDF    Communication Communication Communication: No difficulties   Cognition Arousal/Alertness: Awake/alert Behavior During Therapy: WFL for tasks assessed/performed Overall Cognitive Status: Within Functional Limits for tasks assessed                                     General Comments       Exercises     Shoulder Instructions      Home Living Family/patient expects to be discharged to:: Private residence Living Arrangements: Other relatives Available Help at Discharge: Family;Available 24 hours/day Type of Home: House Home Access: Stairs to enter [employer] of Steps: 2 Entrance Stairs-Rails: Left Home Layout: Multi-level;Able to live on main level with bedroom/bathroom     Bathroom Shower/Tub: Occupational psychologist: Standard     Home Equipment: None          Prior Functioning/Environment Level of Independence: Independent  OT Problem List:        OT Treatment/Interventions:      OT Goals(Current goals can be found in the care plan section) Acute Rehab OT Goals Patient Stated Goal: to go home  OT Frequency:     Barriers to D/C:            Co-evaluation              AM-PAC OT "6 Clicks" Daily Activity     Outcome Measure Help from another person eating meals?: None Help from another person taking care of personal grooming?: None Help from another person toileting, which includes using toliet, bedpan, or urinal?: None Help from another person bathing (including washing, rinsing, drying)?: None Help from another person to put on and taking off regular upper body clothing?: None Help from another person to put on and taking off regular lower body clothing?: None 6 Click Score: 24   End of Session Equipment Utilized  During Treatment: Cervical collar  Activity Tolerance: Patient tolerated treatment well Patient left: in bed;with call bell/phone within reach  OT Visit Diagnosis: Pain                Time: [id number] OT Time Calculation (min): 17 min Charges:  OT General Charges $OT Visit: 1 Visit OT Evaluation $OT Eval Low Complexity: 1 Low  Martie RoundJulie Tarquin Welcher, OTR/L Acute Rehabilitation Services Pager: [phone] Office: [phone]  Evern BioMayberry, Caralynn Gelber Lynn 01/14/2019, 8:57 AM

## 2019-01-15 ENCOUNTER — Emergency Department (HOSPITAL_COMMUNITY)
Admission: EM | Admit: 2019-01-15 | Discharge: 2019-01-15 | Disposition: A | Discharge status: Home / Self Care | Payer: 59 | Attending: Emergency Medicine | Admitting: Emergency Medicine

## 2019-01-15 ENCOUNTER — Other Ambulatory Visit: Payer: Self-pay

## 2019-01-15 ENCOUNTER — Encounter (HOSPITAL_COMMUNITY): Payer: Self-pay | Admitting: Emergency Medicine

## 2019-01-15 DIAGNOSIS — L7622 Postprocedural hemorrhage and hematoma of skin and subcutaneous tissue following other procedure: Secondary | ICD-10-CM | POA: Diagnosis present

## 2019-01-15 DIAGNOSIS — I10 Essential (primary) hypertension: Secondary | ICD-10-CM | POA: Insufficient documentation

## 2019-01-15 DIAGNOSIS — Z87891 Personal history of nicotine dependence: Secondary | ICD-10-CM | POA: Insufficient documentation

## 2019-01-15 DIAGNOSIS — G8918 Other acute postprocedural pain: Secondary | ICD-10-CM | POA: Diagnosis not present

## 2019-01-15 DIAGNOSIS — Z96652 Presence of left artificial knee joint: Secondary | ICD-10-CM | POA: Insufficient documentation

## 2019-01-15 DIAGNOSIS — Z79899 Other long term (current) drug therapy: Secondary | ICD-10-CM | POA: Diagnosis not present

## 2019-01-15 DIAGNOSIS — J45909 Unspecified asthma, uncomplicated: Secondary | ICD-10-CM | POA: Insufficient documentation

## 2019-01-15 NOTE — Discharge Summary (Signed)
Patient ID: Billy Shaffer MRN: [id number] DOB/AGE: 52/02/1966 52 y.o.  Admit date: 01/13/2019 Discharge date: 01/15/2019  Admission Diagnoses:  Active Problems:   Cervical disc herniation   Discharge Diagnoses:  Active Problems:   Cervical disc herniation  status post Procedure(s): ANTERIOR CERVICAL DECOMPRESSION/DISCECTOMY FUSION C5-7  Past Medical History:  Diagnosis Date   Anxiety    impending surgery   Arthritis    Asthma    Bronchitis, allergic    usese inhaler at times Ventolin   Cervical spondylosis with radiculopathy    Headache    Hypertension    takes med   Shortness of breath 03/16/2012   "right now; cause of neck pain"    Surgeries: Procedure(s): ANTERIOR CERVICAL DECOMPRESSION/DISCECTOMY FUSION C5-7 on 01/13/2019   Consultants: None  Discharged Condition: Improved  Hospital Course: Billy Shaffer is an 53 y.o. male who was admitted 01/13/2019 for operative treatment of herniated cervical disc with cervical radiculopathy. Patient failed conservative treatments (please see the history and physical for the specifics) and had severe unremitting pain that affects sleep, daily activities and work/hobbies. After pre-op clearance, the patient was taken to the operating room on 01/13/2019 and underwent  Procedure(s): ANTERIOR CERVICAL DECOMPRESSION/DISCECTOMY FUSION C5-7.    Patient was given perioperative antibiotics:  Anti-infectives (From admission, onward)   Start     Dose/Rate Route Frequency Ordered Stop   01/13/19 1800  ceFAZolin (ANCEF) IVPB 1 g/50 mL premix     1 g 100 mL/hr over 30 Minutes Intravenous Every 8 hours 01/13/19 1508 01/14/19 0832   01/13/19 0823  ceFAZolin (ANCEF) IVPB 2g/100 mL premix     2 g 200 mL/hr over 30 Minutes Intravenous 30 min pre-op 01/13/19 [id number] 01/13/19 0959       Patient was given sequential compression devices and early ambulation to prevent DVT.   Patient benefited maximally from hospital stay and there were no  complications. At the time of discharge, the patient was urinating/moving their bowels without difficulty, tolerating a regular diet, pain is controlled with oral pain medications and they have been cleared by PT/OT.   Recent vital signs: No data found.   Recent laboratory studies: No results for input(s): WBC, HGB, HCT, PLT, NA, K, CL, CO2, BUN, CREATININE, GLUCOSE, INR, CALCIUM in the last 72 hours.  Invalid input(s): PT, 2   Discharge Medications:   Allergies as of 01/14/2019      Reactions   Vicodin [hydrocodone-acetaminophen] Itching      Medication List    TAKE these medications   albuterol 108 (90 Base) MCG/ACT inhaler Commonly known as: VENTOLIN HFA Inhale 2 puffs into the lungs every 4 (four) hours as needed for wheezing or shortness of breath.   lisinopril 40 MG tablet Commonly known as: ZESTRIL Take 40 mg by mouth daily.   LORazepam 0.5 MG tablet Commonly known as: ATIVAN Take 0.5 mg by mouth at bedtime as needed for sleep.   methocarbamol 500 MG tablet Commonly known as: Robaxin Take 1 tablet (500 mg total) by mouth every 8 (eight) hours as needed for up to 5 days for muscle spasms.   ondansetron 4 MG tablet Commonly known as: Zofran Take 1 tablet (4 mg total) by mouth every 8 (eight) hours as needed for nausea or vomiting.   oxyCODONE-acetaminophen 10-325 MG tablet Commonly known as: Percocet Take 1 tablet by mouth every 6 (six) hours as needed for up to 5 days for pain.   testosterone cypionate 100 MG/ML injection Commonly known as:  DEPOTESTOTERONE CYPIONATE Inject 100 mg into the muscle every 14 (fourteen) days.       Diagnostic Studies: Dg Chest 2 View  Result Date: 01/10/2019 CLINICAL DATA:  Pre-op respiratory exam for cervical spine discectomy and fusion. EXAM: CHEST - 2 VIEW COMPARISON:  12/01/2014 FINDINGS: The heart size and mediastinal contours are within normal limits. Both lungs are clear. The visualized skeletal structures are unremarkable.  IMPRESSION: No active cardiopulmonary disease. Electronically Signed   By: Earle Gell M.D.   On: 01/10/2019 09:30   Dg Cervical Spine 2-3 Views  Result Date: 01/13/2019 CLINICAL DATA:  Spine surgery EXAM: CERVICAL SPINE - 2-3 VIEW; DG C-ARM 61-120 MIN COMPARISON:  CTA neck 10/22/2018 FINDINGS: Three low resolution intraoperative spot views of the cervical spine. Total fluoroscopy time was 44 seconds. Previous anterior plate and fixating screws C3 through C5 with interbody devices. Images demonstrate placement of interbody device and fixating screws at C5-C6 and C6-C7. IMPRESSION: Intraoperative fluoroscopic assistance provided during cervical spine surgery. Electronically Signed   By: Donavan Foil M.D.   On: 01/13/2019 14:55   Dg C-arm 1-60 Min  Result Date: 01/13/2019 CLINICAL DATA:  Spine surgery EXAM: CERVICAL SPINE - 2-3 VIEW; DG C-ARM 61-120 MIN COMPARISON:  CTA neck 10/22/2018 FINDINGS: Three low resolution intraoperative spot views of the cervical spine. Total fluoroscopy time was 44 seconds. Previous anterior plate and fixating screws C3 through C5 with interbody devices. Images demonstrate placement of interbody device and fixating screws at C5-C6 and C6-C7. IMPRESSION: Intraoperative fluoroscopic assistance provided during cervical spine surgery. Electronically Signed   By: Donavan Foil M.D.   On: 01/13/2019 14:55    Discharge Instructions    Incentive spirometry RT   Complete by: As directed       Follow-up Information    Melina Schools, MD. Schedule an appointment as soon as possible for a visit in 2 weeks.   Specialty: Orthopedic Surgery Why: If symptoms worsen, For suture removal, For wound re-check Contact information: [address] [place] [address] [phone]           Discharge Plan:  discharge to home  Disposition: Stable    Signed: Yvonne Kendall Briceida Rasberry for [facility] PA-C [facility] [phone] 01/15/2019, 1:31 PM
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
## 2019-01-15 NOTE — Discharge Instructions (Addendum)
Bleeding was minimal today  We recommended conservative approach an deferred further imaging today  Call your neurosurgeon tomorrow to determine when you need to follow up with them  Monitor and return for worsening bleeding, swelling, pain, fever

## 2019-01-15 NOTE — ED Triage Notes (Signed)
Patient reports having cervical discectomy two days ago.  Was discharged yesterday.  Woke up tonight with bleeding that has saturated dressing.  Spoke with on call physician.  Was told to come to ER to get checked out.

## 2019-01-18 ENCOUNTER — Encounter (HOSPITAL_COMMUNITY): Payer: Self-pay | Admitting: Orthopedic Surgery

## 2019-01-27 ENCOUNTER — Encounter (HOSPITAL_COMMUNITY): Payer: Self-pay | Admitting: Orthopedic Surgery

## 2020-03-05 ENCOUNTER — Encounter: Payer: Self-pay | Admitting: Physical Medicine & Rehabilitation

## 2020-03-27 ENCOUNTER — Encounter: Payer: Self-pay | Admitting: Interventional Cardiology

## 2020-03-27 ENCOUNTER — Ambulatory Visit (INDEPENDENT_AMBULATORY_CARE_PROVIDER_SITE_OTHER): Payer: Worker's Compensation | Admitting: Interventional Cardiology

## 2020-03-27 ENCOUNTER — Other Ambulatory Visit: Payer: Self-pay

## 2020-03-27 VITALS — BP 114/70 | HR 78 | Ht 74.0 in | Wt 235.0 lb

## 2020-03-27 DIAGNOSIS — I10 Essential (primary) hypertension: Secondary | ICD-10-CM | POA: Diagnosis not present

## 2020-03-27 DIAGNOSIS — Z720 Tobacco use: Secondary | ICD-10-CM

## 2020-03-27 DIAGNOSIS — R55 Syncope and collapse: Secondary | ICD-10-CM | POA: Diagnosis not present

## 2020-03-27 NOTE — Patient Instructions (Signed)
Medication Instructions:  Your physician recommends that you continue on your current medications as directed. Please refer to the Current Medication list given to you today.  *If you need a refill on your cardiac medications before your next appointment, please call your pharmacy*   Lab Work: None  If you have labs (blood work) drawn today and your tests are completely normal, you will receive your results only by: Marland Kitchen MyChart Message (if you have MyChart) OR . A paper copy in the mail If you have any lab test that is abnormal or we need to change your treatment, we will call you to review the results.   Testing/Procedures: Your physician has requested that you have an echocardiogram. Echocardiography is a painless test that uses sound waves to create images of your heart. It provides your doctor with information about the size and shape of your heart and how well your heart's chambers and valves are working. This procedure takes approximately one hour. There are no restrictions for this procedure.   Follow-Up: Based on test results  Other Instructions None
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
## 2020-04-12 ENCOUNTER — Other Ambulatory Visit: Payer: Self-pay

## 2020-04-12 ENCOUNTER — Ambulatory Visit (HOSPITAL_COMMUNITY): Payer: Worker's Compensation | Attending: Cardiology

## 2020-04-12 DIAGNOSIS — R55 Syncope and collapse: Secondary | ICD-10-CM | POA: Diagnosis not present

## 2020-04-12 LAB — ECHOCARDIOGRAM COMPLETE
Area-P 1/2: 5.02 cm2
S' Lateral: 2.9 cm

## 2020-04-17 ENCOUNTER — Telehealth: Payer: Self-pay | Admitting: Interventional Cardiology

## 2020-04-17 NOTE — Telephone Encounter (Signed)
Calling for a copy of patient's echo report & note faxed [phone].

## 2020-04-26 ENCOUNTER — Encounter
Payer: Worker's Compensation | Attending: Physical Medicine & Rehabilitation | Admitting: Physical Medicine & Rehabilitation

## 2020-04-26 ENCOUNTER — Encounter: Payer: Self-pay | Admitting: Physical Medicine & Rehabilitation

## 2020-04-26 ENCOUNTER — Other Ambulatory Visit: Payer: Self-pay

## 2020-04-26 VITALS — BP 119/84 | HR 89 | Temp 98.4°F | Ht 74.0 in | Wt 225.2 lb

## 2020-04-26 DIAGNOSIS — G479 Sleep disorder, unspecified: Secondary | ICD-10-CM | POA: Diagnosis not present

## 2020-04-26 DIAGNOSIS — R410 Disorientation, unspecified: Secondary | ICD-10-CM | POA: Diagnosis present

## 2020-04-26 DIAGNOSIS — R55 Syncope and collapse: Secondary | ICD-10-CM | POA: Diagnosis present

## 2020-04-26 DIAGNOSIS — R269 Unspecified abnormalities of gait and mobility: Secondary | ICD-10-CM | POA: Diagnosis not present

## 2020-04-26 DIAGNOSIS — F0781 Postconcussional syndrome: Secondary | ICD-10-CM

## 2020-04-26 DIAGNOSIS — R42 Dizziness and giddiness: Secondary | ICD-10-CM | POA: Diagnosis not present

## 2020-04-26 MED ORDER — AMITRIPTYLINE HCL 10 MG PO TABS: 10.0000 mg | ORAL_TABLET | Freq: Every day | ORAL | 1 refills | Status: DC

## 2020-04-26 NOTE — Progress Notes (Signed)
Subjective:    Patient ID: Billy Shaffer, male    DOB: March 26, 1966, 54 y.o.   MRN: [id number]  HPI Male with pmh/psh HTN cervical radiculopathy OA, anxiety, left knee TKA, ACDF in 2012 and again 2020 presents with post-concussive symptoms. Main symptoms are headache, dizziness, nausea, confusion.  Started 11/2019.  Stable. Headaches start posterior and radiate to temple.  Oxycodone and Tizanidine help. Prolonged postures exacerbate the pain.  Dizziness/syncope are unpredictable.   Patient states he was told by Ortho that he sustained a C2-3 fracture and torn ligament. He had a recent sycope with fall, breaking his right thumb. He is seeing a Neurology. He currently has an ambulatory EEG. He had NCS/EMG showing "nerve damage - mod/severe on left and mod on right".  Associated numbness in b/l hands. Denies PTSD symptoms. Symptoms limit all activities.   Pain Inventory Average Pain 4 Pain Right Now 6 My pain is constant, sharp, dull and stabbing  In the last 24 hours, has pain interfered with the following? General activity 0 Relation with others 4 Enjoyment of life 10 What TIME of day is your pain at its worst? morning  Sleep (in general) Fair  Pain is worse with: walking, bending, sitting, inactivity and standing Pain improves with: heat/ice, medication and injections Relief from Meds: 7  how many minutes can you walk? 5 mins ability to climb steps?  yes do you drive?  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  yes  weakness numbness tremor tingling trouble walking dizziness confusion  New Patient  New Patient    Family History  Problem Relation Age of Onset  . Diabetes Mother   . Breast cancer Mother    Social History   Socioeconomic History  . Marital status: Married    Spouse name: Not on file  . Number of  children: Not on file  . Years of education: Not on file  . Highest education level: Not on file  Occupational History  . Not on file  Tobacco Use  . Smoking status: Former Smoker    Packs/day: 2.00    Years: 38.00    Pack years: 76.00    Types: Cigarettes    Quit date: 12/27/2018    Years since quitting: 1.3  . Smokeless tobacco: Never Used  Vaping Use  . Vaping Use: Never used  Substance and Sexual Activity  . Alcohol use: Yes    Comment: occasional  . Drug use: No  . Sexual activity: Yes  Other Topics Concern  . Not on file  Social History Narrative  . Not on file   Social Determinants of Health   Financial Resource Strain:   . Difficulty of Paying Living Expenses: Not on file  Food Insecurity:   . Worried About Programme researcher, broadcasting/film/video in the Last Year: Not on file  . Ran Out of Food in the Last Year: Not on file  Transportation Needs:   . Lack of Transportation (Medical): Not on file  . Lack of Transportation (Non-Medical): Not on file  Physical Activity:   . Days of Exercise per Week: Not on file  . Minutes of Exercise per Session: Not on file  Stress:   . Feeling of Stress : Not on file  Social Connections:   . Frequency of Communication with Friends and Family:  Not on file  . Frequency of Social Gatherings with Friends and Family: Not on file  . Attends Religious Services: Not on file  . Active Member of Clubs or Organizations: Not on file  . Attends Banker Meetings: Not on file  . Marital Status: Not on file   Past Surgical History:  Procedure Laterality Date  . ANTERIOR CERVICAL DECOMP/DISCECTOMY FUSION  03/10/2012   Procedure: ANTERIOR CERVICAL DECOMPRESSION/DISCECTOMY FUSION 2 LEVELS;  Surgeon: Venita Lick, MD;  Location: [facility] OR;  Service: Orthopedics;  Laterality: N/A;  ACDF C3-5  . ANTERIOR CERVICAL DECOMP/DISCECTOMY FUSION N/A 01/13/2019   Procedure: ANTERIOR CERVICAL DECOMPRESSION/DISCECTOMY FUSION C5-7;  Surgeon: Venita Lick, MD;   Location: [facility] OR;  Service: Orthopedics;  Laterality: N/A;  180 mins  . APPENDECTOMY    . HEMATOMA EVACUATION  03/16/2012   Procedure: EVACUATION HEMATOMA;  Surgeon: Venita Lick, MD;  Location: [facility] OR;  Service: Orthopedics;  Laterality: N/A;  evacuation hematoma  . INGUINAL HERNIA REPAIR     hernia repair with mesh  . JOINT REPLACEMENT    . TOTAL KNEE ARTHROPLASTY     "left; for torn ACL"  . WISDOM TOOTH EXTRACTION     Past Medical History:  Diagnosis Date  . Anxiety    impending surgery  . Arthritis   . Asthma   . Bronchitis, allergic    usese inhaler at times Ventolin  . Cervical spondylosis with radiculopathy   . Headache   . Hypertension    takes med  . Shortness of breath 03/16/2012   "right now; cause of neck pain"   BP 119/84   Pulse 89   Temp 98.4 F (36.9 C)   Ht 6\' 2"  (1.88 m)   Wt 225 lb 3.2 oz (102.2 kg)   SpO2 98%   BMI 28.91 kg/m   Opioid Risk Score:   Fall Risk Score:  `1  Depression screen PHQ 2/9  Depression screen PHQ 2/9 04/26/2020  Decreased Interest 3  Down, Depressed, Hopeless 0  PHQ - 2 Score 3  Altered sleeping 0  Tired, decreased energy 1  Change in appetite 0  Feeling bad or failure about yourself  0  Trouble concentrating 1  Moving slowly or fidgety/restless 1  Suicidal thoughts 0  PHQ-9 Score 6   Review of Systems  Musculoskeletal: Positive for gait problem.       Trouble walking  Neurological: Positive for dizziness, tremors, syncope, weakness, light-headedness, numbness and headaches.       Tingling  Psychiatric/Behavioral: Positive for confusion and decreased concentration.       Objective:   Physical Exam Constitutional: No distress . Vital signs reviewed. HENT: EEG leads in place with covering.  Eyes: EOMI. No discharge. Cardiovascular: No JVD.   Respiratory: Normal effort.  No stridor.   GI: Non-distended.   Skin: Warm and dry.  Intact. Psych: Normal mood.  Normal behavior. Musc: No edema in extremities.  No  tenderness in extremities. Neuro: Alert Motor: grossly 4/5 throughout Attention/concentration: Limited     Assessment & Plan:  Male with pmh/psh HTN cervical radiculopathy OA, anxiety, left knee TKA, ACDF in 2012 and again 2020 presents with post-concussive symptoms.  1. Post concussive syndrome  Predominantly with headache, dizziness, nausea, confusion, cervicalgia   Per CT report, unremarkable, however, pt pt per ortho C2-3 fracture with ligamentous tear  NCS/EMG showing bilateral median neuropathy,?  Polyneuropathy  Labs reviewed  Referral information reviewed - concussion  Continue cold  Side effects with Gabapentin in  past  PT currently on hold by Ortho  Being seen by Neurology  Being seen by Cards  Will consider Cymbalta  Will consider Robaxin   Will consider referral to Psychology  Will consider SLP referral after Neurology plan  Will consider occipital nerve blocks  Will consider Scopalamine  Patient states main goal is to return to driving  ? Plans for surgical intervention on LUE and RUE  ? Plan for C2-3 facet injection  Discussed with workman's comp case manager, more recent notes obtained and reviewed.    2. Gait abnormality  Does not need a cane at present due to sedentary lifestyle  3. Sleep disturbance  Fair  Will trial Elavil 10 qhs, educated on signs/symptoms of seratonin syndrome  >60 minutes spent in total in counseling and coordination of care, and reviewing records from Ortho, pain management, cardiology-echo ordered, plan for C2-C3 facet injections,?  CTS surgery, discussing with patient history, prognosis, course of treatment, discussing with case manager hypertension, etc.

## 2020-05-29 ENCOUNTER — Encounter: Payer: Worker's Compensation | Admitting: Physical Medicine & Rehabilitation

## 2020-06-07 ENCOUNTER — Encounter
Payer: Worker's Compensation | Attending: Physical Medicine & Rehabilitation | Admitting: Physical Medicine & Rehabilitation

## 2020-06-07 ENCOUNTER — Other Ambulatory Visit: Payer: Self-pay

## 2020-06-07 ENCOUNTER — Encounter: Payer: Self-pay | Admitting: Physical Medicine & Rehabilitation

## 2020-06-07 VITALS — BP 94/59 | HR 91 | Temp 98.9°F | Ht 74.0 in | Wt 236.0 lb

## 2020-06-07 DIAGNOSIS — R269 Unspecified abnormalities of gait and mobility: Secondary | ICD-10-CM

## 2020-06-07 DIAGNOSIS — F0781 Postconcussional syndrome: Secondary | ICD-10-CM

## 2020-06-07 DIAGNOSIS — G479 Sleep disorder, unspecified: Secondary | ICD-10-CM | POA: Diagnosis not present

## 2020-06-07 DIAGNOSIS — R42 Dizziness and giddiness: Secondary | ICD-10-CM | POA: Diagnosis not present

## 2020-06-07 MED ORDER — AMITRIPTYLINE HCL 25 MG PO TABS: 25.0000 mg | ORAL_TABLET | Freq: Every day | ORAL | 1 refills | Status: AC

## 2020-06-07 NOTE — Progress Notes (Signed)
Subjective:    Patient ID: Billy Shaffer, male    DOB: Jun 11, 1966, 54 y.o.   MRN: [id number]  HPI Male with pmh/psh HTN cervical radiculopathy OA, anxiety, left knee TKA, ACDF in 2012 and again 2020 presents with post-concussive symptoms.  Initially stated: Main symptoms are headache, dizziness, nausea, confusion.  Started 11/2019.  Stable. Headaches start posterior and radiate to temple.  Oxycodone and Tizanidine help. Prolonged postures exacerbate the pain.  Dizziness/syncope are unpredictable.   Patient states he was told by Ortho that he sustained a C2-3 fracture and torn ligament. He had a recent sycope with fall, breaking his right thumb. He is seeing a Neurology. He currently has an ambulatory EEG. He had NCS/EMG showing "nerve damage - mod/severe on left and mod on right".  Associated numbness in b/l hands. Denies PTSD symptoms. Symptoms limit all activities.   Last clinic visit on 04/26/20.  Since that time, pt states he had an EEG, Echo which was neg. He is supposed to have carotid artery U/S. He states there are plans for surgery on LUE, but it is not being covered by [employer], and has financial constraints, so he has yet to decide. He believes the more his neck hurts, the more dizzy he gets. He had a C2-3 facet injection, with plans to ?RFA.  States he has had falls since last visit. Sleep is fair. He does not notice change with Elavil, later states he falls asleep faster.  Headaches are improving. Dizziness improving. Nausea, confusion, persistent.  Cervicalgia getting worse.    Pain Inventory Average Pain 4 Pain Right Now 2 My pain is constant, sharp, stabbing and tingling  In the last 24 hours, has pain interfered with the following? General activity 7 Relation with others 3 Enjoyment of life 1 What TIME of day is your pain at its worst? morning  and daytime Sleep (in general) Fair  Pain is worse with: bending and some activites Pain improves with: rest, heat/ice and  medication Relief from Meds: 8  how many minutes can you walk? 5 mins ability to climb steps?  yes do you drive?  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  yes  weakness numbness tremor tingling trouble walking dizziness confusion  New Patient  New Patient    Family History  Problem Relation Age of Onset  . Diabetes Mother   . Breast cancer Mother    Social History   Socioeconomic History  . Marital status: Married    Spouse name: Not on file  . Number of children: Not on file  . Years of education: Not on file  . Highest education level: Not on file  Occupational History  . Not on file  Tobacco Use  . Smoking status: Former Smoker    Packs/day: 2.00    Years: 38.00    Pack years: 76.00    Types: Cigarettes    Quit date: 12/27/2018    Years since quitting: 1.4  . Smokeless tobacco: Never Used  Vaping Use  . Vaping Use: Never used  Substance and Sexual Activity  . Alcohol use: Yes    Comment: occasional  . Drug use: No  . Sexual activity: Yes  Other Topics Concern  . Not on file  Social History Narrative  . Not on file   Social Determinants  of Health   Financial Resource Strain:   . Difficulty of Paying Living Expenses: Not on file  Food Insecurity:   . Worried About Programme researcher, broadcasting/film/video in the Last Year: Not on file  . Ran Out of Food in the Last Year: Not on file  Transportation Needs:   . Lack of Transportation (Medical): Not on file  . Lack of Transportation (Non-Medical): Not on file  Physical Activity:   . Days of Exercise per Week: Not on file  . Minutes of Exercise per Session: Not on file  Stress:   . Feeling of Stress : Not on file  Social Connections:   . Frequency of Communication with Friends and Family: Not on file  . Frequency of Social Gatherings with Friends and Family: Not  on file  . Attends Religious Services: Not on file  . Active Member of Clubs or Organizations: Not on file  . Attends Banker Meetings: Not on file  . Marital Status: Not on file   Past Surgical History:  Procedure Laterality Date  . ANTERIOR CERVICAL DECOMP/DISCECTOMY FUSION  03/10/2012   Procedure: ANTERIOR CERVICAL DECOMPRESSION/DISCECTOMY FUSION 2 LEVELS;  Surgeon: Venita Lick, MD;  Location: [facility];  Service: Orthopedics;  Laterality: N/A;  ACDF C3-5  . ANTERIOR CERVICAL DECOMP/DISCECTOMY FUSION N/A 01/13/2019   Procedure: ANTERIOR CERVICAL DECOMPRESSION/DISCECTOMY FUSION C5-7;  Surgeon: Venita Lick, MD;  Location: [facility];  Service: Orthopedics;  Laterality: N/A;  180 mins  . APPENDECTOMY    . HEMATOMA EVACUATION  03/16/2012   Procedure: EVACUATION HEMATOMA;  Surgeon: Venita Lick, MD;  Location: [facility];  Service: Orthopedics;  Laterality: N/A;  evacuation hematoma  . INGUINAL HERNIA REPAIR     hernia repair with mesh  . JOINT REPLACEMENT    . TOTAL KNEE ARTHROPLASTY     "left; for torn ACL"  . WISDOM TOOTH EXTRACTION     Past Medical History:  Diagnosis Date  . Anxiety    impending surgery  . Arthritis   . Asthma   . Bronchitis, allergic    usese inhaler at times Ventolin  . Cervical spondylosis with radiculopathy   . Headache   . Hypertension    takes med  . Shortness of breath 03/16/2012   "right now; cause of neck pain"   BP (!) 94/59   Pulse 91   Temp 98.9 F (37.2 C)   Ht 6\' 2"  (1.88 m)   Wt 236 lb (107 kg)   SpO2 94%   BMI 30.30 kg/m   Opioid Risk Score:   Fall Risk Score:  `1  Depression screen PHQ 2/9  Depression screen PHQ 2/9 04/26/2020  Decreased Interest 3  Down, Depressed, Hopeless 0  PHQ - 2 Score 3  Altered sleeping 0  Tired, decreased energy 1  Change in appetite 0  Feeling bad or failure about yourself  0  Trouble concentrating 1  Moving slowly or fidgety/restless 1  Suicidal thoughts 0  PHQ-9 Score 6   Review of  Systems  Constitutional: Negative.   HENT: Negative.   Eyes: Negative.   Respiratory: Negative.   Cardiovascular: Negative.   Gastrointestinal: Negative.   Endocrine: Negative.   Musculoskeletal: Positive for arthralgias, back pain, gait problem, joint swelling and neck pain.       Trouble walking  Skin: Negative.   Allergic/Immunologic: Negative.   Neurological: Positive for dizziness, tremors, syncope, weakness, light-headedness, numbness and headaches.       Tingling  Psychiatric/Behavioral: Positive for confusion  and decreased concentration.  All other systems reviewed and are negative.     Objective:   Physical Exam  Constitutional: No distress . Vital signs reviewed. HENT: Normocephalic.  Atraumatic. Eyes: EOMI. No discharge. Cardiovascular: No JVD.   Respiratory: Normal effort.  No stridor.   GI: Non-distended.   Skin: Warm and dry.  Intact. Psych: Normal mood.  Normal behavior. Musc: No edema in extremities.  No tenderness in extremities. Neuro: Alert Motor: grossly 4/5 throughout (some pain inhibition) Attention/concentration: Improving Recall: 0/3 after 5 minutes    Assessment & Plan:  Male with pmh/psh HTN cervical radiculopathy OA, anxiety, left knee TKA, ACDF in 2012 and again 2020 presents with post-concussive symptoms.  1. Post concussive syndrome  Predominantly with    Headache - improving   Dizziness - improving   Nausea - persistent   Confusion - persistent   Cervicalgia  - getting worse  Per CT report, unremarkable, however, pt pt per ortho C2-3 fracture with ligamentous tear  NCS/EMG showing bilateral median neuropathy,?  Polyneuropathy  EEG notes provided and reviewed  Continue cold  Side effects with Gabapentin in past  PT currently on hold by Ortho  Being seen by Neurology  Being seen by Cards  Will consider Cymbalta  Will consider Robaxin   Will consider referral to Psychology  Will refer to SLP   Will consider occipital nerve  blocks  Will consider Scopalamine  Patient states main goal is to return to driving  ? Plans for surgical intervention on LUE > RUE after assessing financial limitations (not being covered by [employer]) and discussion with wife  S/p C2-3 facet injection with ?plans for MBB- cont follow up with Dr. Ethelene Hal  Discussed with workman's comp case manager, more recent notes obtained and reviewed.    2. Gait abnormality  Walker ordered  Therapies on hold  3. Sleep disturbance  Fair  Will increase Elavil to 25qhs, educated on signs/symptoms of seratonin syndrome. Will monitor closely to ensure this does not exacerbate symptoms. Symptoms improving at present.  4. Syncope  Cont follow up with Neurology - notes reviewed  Plan for further workup per Cards

## 2020-06-27 ENCOUNTER — Telehealth: Payer: Self-pay | Admitting: Interventional Cardiology

## 2020-06-27 NOTE — Telephone Encounter (Signed)
I did not need this encounter. °

## 2020-07-05 ENCOUNTER — Encounter: Payer: BC Managed Care – PPO | Admitting: Physical Medicine & Rehabilitation

## 2020-08-02 ENCOUNTER — Ambulatory Visit: Payer: BC Managed Care – PPO | Admitting: Physical Medicine & Rehabilitation

## 2020-08-08 ENCOUNTER — Other Ambulatory Visit (HOSPITAL_COMMUNITY): Payer: Self-pay | Admitting: Cardiology

## 2020-08-08 ENCOUNTER — Telehealth: Payer: Self-pay | Admitting: Interventional Cardiology

## 2020-08-08 ENCOUNTER — Other Ambulatory Visit: Payer: Self-pay

## 2020-08-08 ENCOUNTER — Ambulatory Visit (HOSPITAL_COMMUNITY)
Admission: RE | Admit: 2020-08-08 | Discharge: 2020-08-08 | Disposition: A | Discharge status: Home / Self Care | Payer: Worker's Compensation | Source: Ambulatory Visit | Attending: Cardiology | Admitting: Cardiology

## 2020-08-08 DIAGNOSIS — R42 Dizziness and giddiness: Secondary | ICD-10-CM | POA: Insufficient documentation

## 2020-08-08 NOTE — Telephone Encounter (Signed)
We have not received a fax yet. Called and made [facility] aware. Alternate fax # provided.

## 2020-08-08 NOTE — Telephone Encounter (Signed)
Shirlee Limerick, case manager with [facility], states an order for mobile CV telemetry was faxed to the office on 08/07/20 and she would like to ensure that it has been received. Please advise.  Phone #: [phone]

## 2020-08-09 NOTE — Telephone Encounter (Signed)
Fax has been received. Attempted to contact Shirlee Limerick back and let her know but also need to clarify orders. Left a message for her to call back.

## 2020-08-10 NOTE — Telephone Encounter (Signed)
Follow Up:      She is returning Brittany's call from yesterday.

## 2020-08-10 NOTE — Telephone Encounter (Signed)
Left message for Billy Shaffer to call back.

## 2020-08-14 ENCOUNTER — Telehealth: Payer: Self-pay | Admitting: *Deleted

## 2020-08-14 NOTE — Telephone Encounter (Signed)
-----   Message from Antoine Poche, MD sent at 08/13/2020 12:50 PM EST ----- Just very mild plaque in the arteries of the neck, nothing of concern at this time   Dominga Ferry MD

## 2020-08-14 NOTE — Telephone Encounter (Signed)
Pt voiced understanding

## 2020-08-15 ENCOUNTER — Telehealth: Payer: Self-pay | Admitting: Interventional Cardiology

## 2020-08-15 NOTE — Telephone Encounter (Signed)
Left message to call office

## 2020-08-15 NOTE — Telephone Encounter (Signed)
Billy Shaffer was returning [name]'s call to schedule the mobile VT Telemetry test. Please call back

## 2020-08-15 NOTE — Telephone Encounter (Signed)
Marion from [facility] is calling requesting to speak to Dr. Hoyle Barr Nurse [place] regarding this patients most recent Carotid . Please advise. She can be reached at [phone]

## 2020-08-15 NOTE — Telephone Encounter (Signed)
I spoke with Shirlee Limerick who reports patient does not need an office visit.  Only needs to wear monitor.  I let her know requested monitor number was not in our system and we needed to know what type of monitor was needed.  Shirlee Limerick will check on this.  Office fax number provided.

## 2020-08-16 ENCOUNTER — Telehealth: Payer: Self-pay | Admitting: Interventional Cardiology

## 2020-08-16 DIAGNOSIS — R55 Syncope and collapse: Secondary | ICD-10-CM

## 2020-08-16 DIAGNOSIS — R42 Dizziness and giddiness: Secondary | ICD-10-CM

## 2020-08-16 NOTE — Telephone Encounter (Signed)
Spoke with Caryl Asp who needed the name of the type of monitor.  They are ordering a 30 day event monitor.  Advised [id number] - cardiac event monitor - then answer questions within the order.  She will c/b if further questions.

## 2020-08-16 NOTE — Telephone Encounter (Signed)
Tresa Endo from [facility] is calling following up on a request for this patient to wear a heart monitor. She can be contacted at [phone]. Please advise.

## 2020-08-16 NOTE — Telephone Encounter (Signed)
See call from yesterday in regards to monitor request.

## 2020-08-20 ENCOUNTER — Encounter: Payer: Self-pay | Admitting: *Deleted

## 2020-08-20 NOTE — Telephone Encounter (Signed)
Returned call to [place].  Per Shirlee Limerick, she has faxed over a order for Holter Monitor for pt.  Explained to her that Dr. Hoyle Barr RN is not in the office today, but that when the order is received, it would be placed in Epic and then the monitor will be mailed to the pt. She was very appreciative for the call back.

## 2020-08-20 NOTE — Telephone Encounter (Signed)
Received fax from [employer], Worker's Comp Case Production designer, theatre/television/film, for a order for pt to have a Event Monitor. Order will be placed.

## 2020-08-20 NOTE — Progress Notes (Signed)
Patient ID: Billy Shaffer, male   DOB: 06-Nov-1965, 55 y.o.   MRN: [id number] Patient enrolled for [employer] to ship a 30 day cardiac event monitor to his home.  Letter with instructions mailed to patient.

## 2020-08-20 NOTE — Telephone Encounter (Signed)
Returned call back to Herminio Heads, Case Manager.  I have advised her that we don't do a Holter Monitor.  She will reach out to the Practitioner that's doing all the requesting of the ordering of the test and let them know and will communicate when she finds out what they want to order since we don't do a holter monitor any longer.    Will route to Velvet Bathe, RN just for [employer].

## 2020-08-20 NOTE — Addendum Note (Signed)
Addended by: Burnetta Sabin on: 08/20/2020 10:35 AM   Modules accepted: Orders

## 2020-08-20 NOTE — Telephone Encounter (Signed)
Billy Shaffer calling back.

## 2020-08-29 ENCOUNTER — Ambulatory Visit (INDEPENDENT_AMBULATORY_CARE_PROVIDER_SITE_OTHER): Payer: Worker's Compensation

## 2020-08-29 DIAGNOSIS — R55 Syncope and collapse: Secondary | ICD-10-CM

## 2020-08-29 DIAGNOSIS — R42 Dizziness and giddiness: Secondary | ICD-10-CM

## 2020-09-11 NOTE — Telephone Encounter (Signed)
See telephone encounters from 1/19 and 1/20.

## 2020-10-03 NOTE — Progress Notes (Signed)
Pt has been made aware of normal result and verbalized understanding.  jw

## 2020-10-16 ENCOUNTER — Telehealth: Payer: Self-pay | Admitting: *Deleted

## 2020-10-16 NOTE — Telephone Encounter (Signed)
Received phone call from Benjiman Core with [employer] worker's comp. Phone [phone].  She is requesting results of monitor.  Request is scanned under documentation list.  Fax number on request confirmed.  I told Armando Reichert I would ask medical records to send results to her.

## 2020-10-19 NOTE — Telephone Encounter (Signed)
They will need to send [place] a request on their fax cover sheet or letterhead for [place] to send records.

## 2020-10-19 NOTE — Telephone Encounter (Signed)
Monitor results were faxed on 10/16/20, they called, their fax machine not working, records were mailed 10/19/20

## 2020-12-12 IMAGING — RF DG C-ARM 61-120 MIN
1 series · 3 of 3 positions shown · non-contrast
Comparison: CTA neck 10/22/2018

CLINICAL DATA: Spine surgery

EXAM:
CERVICAL SPINE - 2-3 VIEW; DG C-ARM 61-120 MIN

[Series 1: run · 3 of 3 slices shown]
[im 1/3]
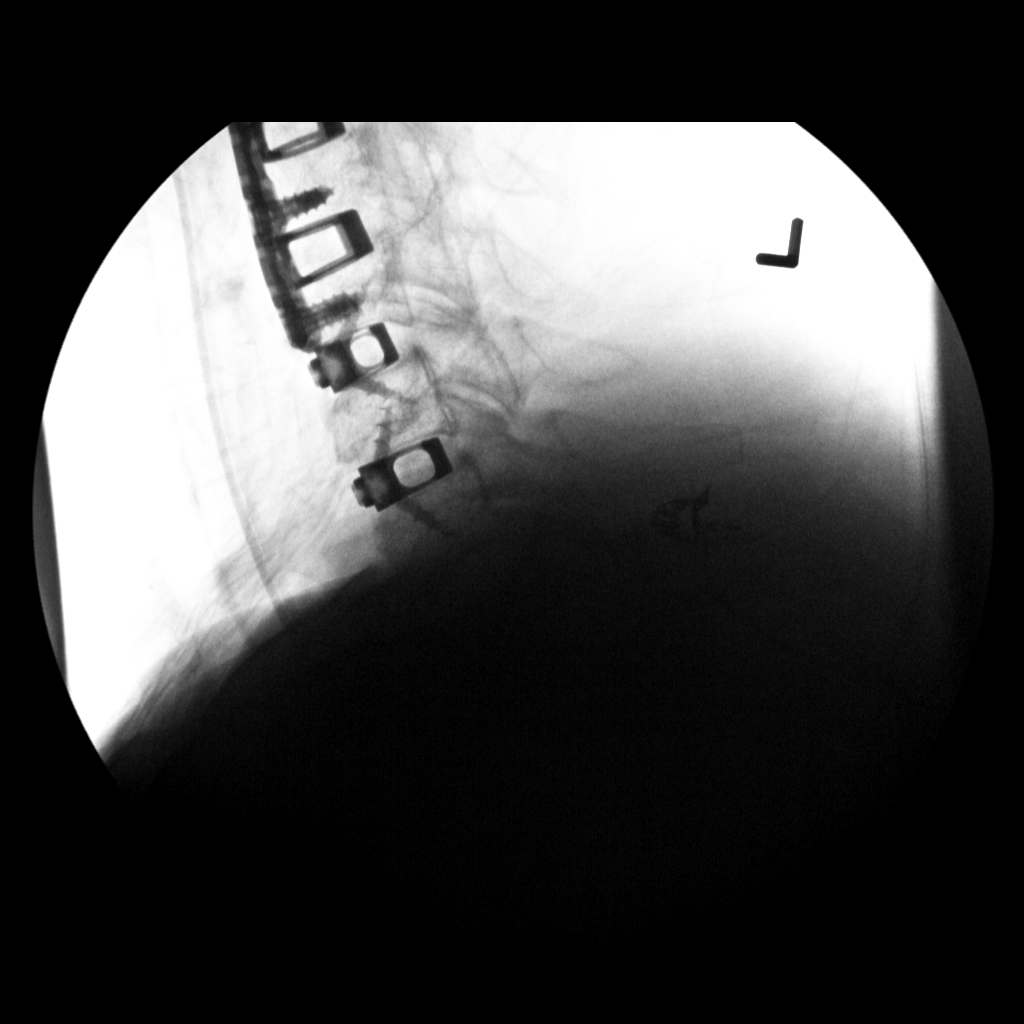
[im 2/3]
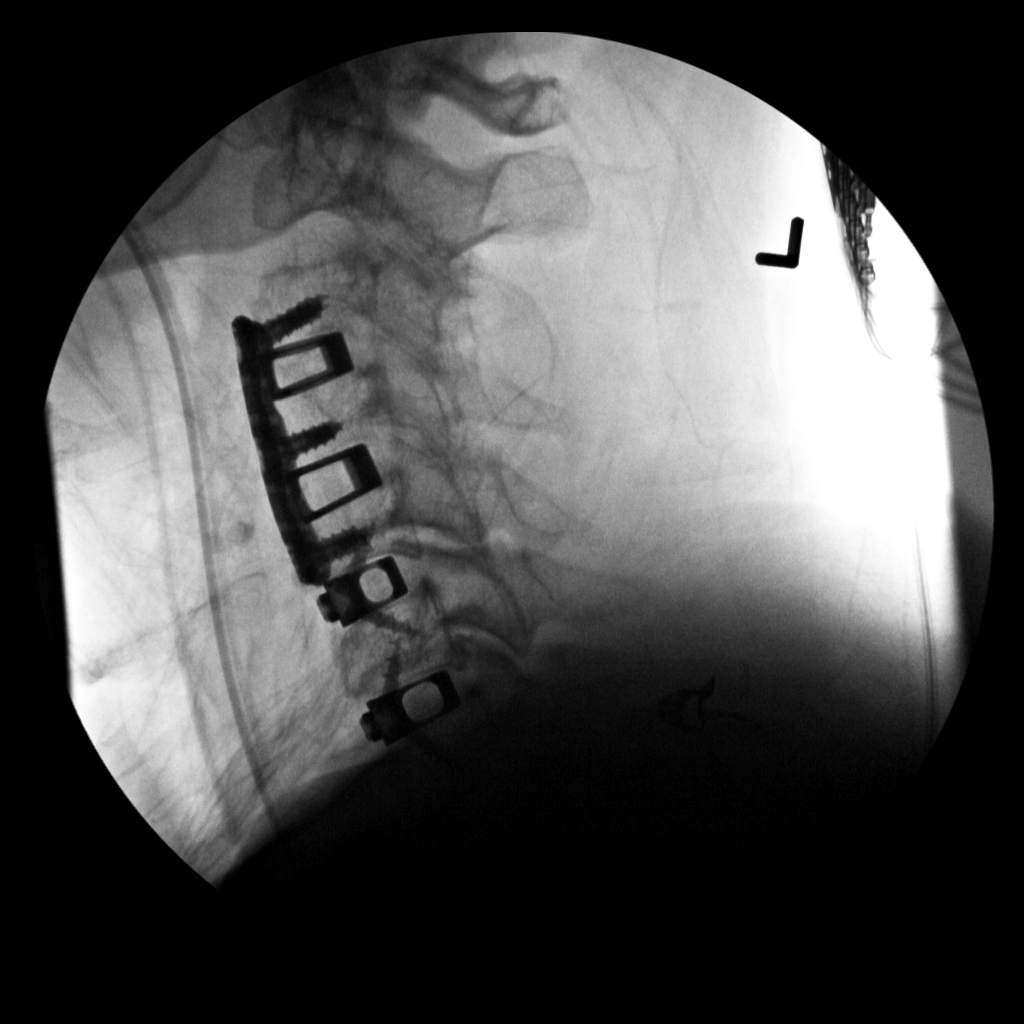
[im 3/3]
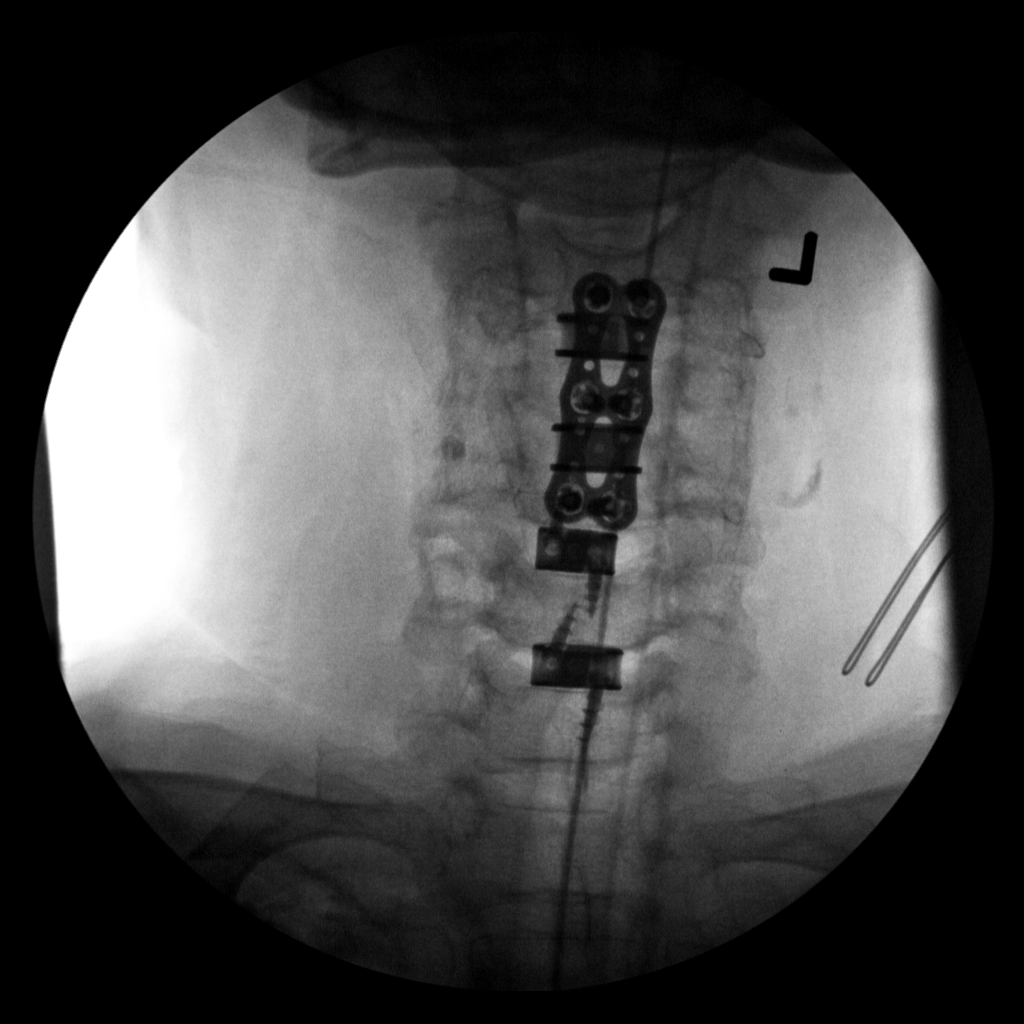

[3 of 3 positions shown; findings below may reference images not displayed]

FINDINGS: Three low resolution intraoperative spot views of the cervical
spine. Total fluoroscopy time was 44 seconds. Previous anterior
plate and fixating screws C3 through C5 with interbody devices.
Images demonstrate placement of interbody device and fixating screws
at C5-C6 and C6-C7.
IMPRESSION: Intraoperative fluoroscopic assistance provided during cervical
spine surgery.
